# Patient Record
Sex: Female | Born: 1960 | Race: White | Hispanic: No | Marital: Married | Smoking: Never smoker
Health system: Southern US, Community
[De-identification: ages and names within clinical notes are randomized; demographics above are authoritative.]

## PROBLEM LIST (undated history)

## (undated) DIAGNOSIS — R748 Abnormal levels of other serum enzymes: Secondary | ICD-10-CM

## (undated) DIAGNOSIS — M25559 Pain in unspecified hip: Secondary | ICD-10-CM

## (undated) DIAGNOSIS — R519 Headache, unspecified: Secondary | ICD-10-CM

## (undated) DIAGNOSIS — G2581 Restless legs syndrome: Secondary | ICD-10-CM

## (undated) DIAGNOSIS — K529 Noninfective gastroenteritis and colitis, unspecified: Secondary | ICD-10-CM

## (undated) DIAGNOSIS — R51 Headache: Secondary | ICD-10-CM

## (undated) DIAGNOSIS — E039 Hypothyroidism, unspecified: Secondary | ICD-10-CM

## (undated) DIAGNOSIS — M940 Chondrocostal junction syndrome [Tietze]: Secondary | ICD-10-CM

## (undated) DIAGNOSIS — M545 Low back pain, unspecified: Secondary | ICD-10-CM

## (undated) DIAGNOSIS — J302 Other seasonal allergic rhinitis: Secondary | ICD-10-CM

## (undated) DIAGNOSIS — M5416 Radiculopathy, lumbar region: Secondary | ICD-10-CM

## (undated) DIAGNOSIS — K219 Gastro-esophageal reflux disease without esophagitis: Secondary | ICD-10-CM

## (undated) DIAGNOSIS — H40053 Ocular hypertension, bilateral: Secondary | ICD-10-CM

## (undated) DIAGNOSIS — R7303 Prediabetes: Secondary | ICD-10-CM

## (undated) DIAGNOSIS — M79643 Pain in unspecified hand: Secondary | ICD-10-CM

## (undated) DIAGNOSIS — M1990 Unspecified osteoarthritis, unspecified site: Secondary | ICD-10-CM

## (undated) DIAGNOSIS — M47816 Spondylosis without myelopathy or radiculopathy, lumbar region: Secondary | ICD-10-CM

## (undated) HISTORY — DX: Pain in unspecified hip: M25.559

## (undated) HISTORY — DX: Spondylosis without myelopathy or radiculopathy, lumbar region: M47.816

## (undated) HISTORY — DX: Pain in unspecified hand: M79.643

## (undated) HISTORY — DX: Low back pain, unspecified: M54.50

## (undated) HISTORY — PX: DILATION AND CURETTAGE OF UTERUS: SHX78

## (undated) HISTORY — PX: ANKLE ARTHROSCOPY: SHX545

## (undated) HISTORY — PX: KNEE ARTHROSCOPY: SUR90

## (undated) HISTORY — PX: WISDOM TOOTH EXTRACTION: SHX21

## (undated) HISTORY — PX: OTHER SURGICAL HISTORY: SHX169

## (undated) HISTORY — DX: Radiculopathy, lumbar region: M54.16

## (undated) HISTORY — PX: CHOLECYSTECTOMY: SHX55

## (undated) HISTORY — PX: CATARACT EXTRACTION: SUR2

---
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
## 1999-03-27 ENCOUNTER — Other Ambulatory Visit: Admission: RE | Admit: 1999-03-27 | Discharge: 1999-03-27 | Payer: Self-pay | Admitting: Obstetrics and Gynecology

## 2000-05-13 ENCOUNTER — Other Ambulatory Visit: Admission: RE | Admit: 2000-05-13 | Discharge: 2000-05-13 | Payer: Self-pay | Admitting: Obstetrics & Gynecology

## 2001-05-20 ENCOUNTER — Other Ambulatory Visit: Admission: RE | Admit: 2001-05-20 | Discharge: 2001-05-20 | Payer: Self-pay | Admitting: Obstetrics & Gynecology

## 2002-06-20 ENCOUNTER — Other Ambulatory Visit: Admission: RE | Admit: 2002-06-20 | Discharge: 2002-06-20 | Payer: Self-pay | Admitting: Obstetrics & Gynecology

## 2003-07-11 ENCOUNTER — Other Ambulatory Visit: Admission: RE | Admit: 2003-07-11 | Discharge: 2003-07-11 | Payer: Self-pay | Admitting: Obstetrics & Gynecology

## 2004-07-10 ENCOUNTER — Other Ambulatory Visit: Admission: RE | Admit: 2004-07-10 | Discharge: 2004-07-10 | Payer: Self-pay | Admitting: Obstetrics & Gynecology

## 2004-09-08 ENCOUNTER — Encounter (INDEPENDENT_AMBULATORY_CARE_PROVIDER_SITE_OTHER): Payer: Self-pay | Admitting: Specialist

## 2004-09-08 ENCOUNTER — Ambulatory Visit (HOSPITAL_COMMUNITY): Admission: RE | Admit: 2004-09-08 | Discharge: 2004-09-08 | Payer: Self-pay | Admitting: Obstetrics & Gynecology

## 2004-12-16 ENCOUNTER — Encounter: Admission: RE | Admit: 2004-12-16 | Discharge: 2004-12-16 | Payer: Self-pay | Admitting: Neurology

## 2005-07-14 ENCOUNTER — Other Ambulatory Visit: Admission: RE | Admit: 2005-07-14 | Discharge: 2005-07-14 | Payer: Self-pay | Admitting: Obstetrics & Gynecology

## 2006-09-03 ENCOUNTER — Encounter: Admission: RE | Admit: 2006-09-03 | Discharge: 2006-09-03 | Payer: Self-pay | Admitting: Internal Medicine

## 2008-06-19 ENCOUNTER — Encounter: Admission: RE | Admit: 2008-06-19 | Discharge: 2008-06-19 | Payer: Self-pay | Admitting: Internal Medicine

## 2009-09-26 ENCOUNTER — Ambulatory Visit (HOSPITAL_COMMUNITY): Admission: RE | Admit: 2009-09-26 | Discharge: 2009-09-26 | Payer: Self-pay | Admitting: Ophthalmology

## 2010-09-01 ENCOUNTER — Other Ambulatory Visit: Payer: Self-pay | Admitting: Obstetrics & Gynecology

## 2010-10-13 LAB — CBC
Hemoglobin: 14.3 g/dL (ref 12.0–15.0)
RBC: 4.39 MIL/uL (ref 3.87–5.11)
RDW: 13.6 % (ref 11.5–15.5)

## 2010-10-13 LAB — BASIC METABOLIC PANEL
Calcium: 9.3 mg/dL (ref 8.4–10.5)
Chloride: 108 mEq/L (ref 96–112)
GFR calc non Af Amer: >60 mL/min (ref >60)
Glucose, Bld: 96 mg/dL (ref 70–99)

## 2010-10-13 LAB — DIFFERENTIAL
Eosinophils Absolute: 1 10*3/uL — ABNORMAL HIGH (ref 0.0–0.7)
Eosinophils Relative: 13 % — ABNORMAL HIGH (ref 0–5)
Monocytes Relative: 8 % (ref 3–12)
Neutro Abs: 3.7 10*3/uL (ref 1.7–7.7)

## 2010-12-05 NOTE — Op Note (Signed)
NAMEBIRTHA, Lori [name]               ACCOUNT NO.:  [id number]   MEDICAL RECORD NO.:  [id number]          PATIENT TYPE:  AMB   LOCATION:  SDC                           FACILITY:  [facility]   PHYSICIAN:  Gerrit Friends. Aldona Bar, M.D.   DATE OF BIRTH:  [birth year]   DATE OF PROCEDURE:  09/08/2004  DATE OF DISCHARGE:                                 OPERATIVE REPORT   PREOPERATIVE DIAGNOSES:  1.  Abnormal uterine bleeding.  2.  Suspect polyp.   POSTOPERATIVE DIAGNOSES:  1.  Abnormal uterine bleeding.  2.  Suspect polyp.  3.  Pathology pending.   PROCEDURE:  Examination under anesthesia and dilatation and curettage.   ANESTHESIA:  Intravenous conscious sedation and paracervical block with 1%  Xylocaine without epinephrine.   SURGEON:  Gerrit Friends. Aldona Bar, M.D.   INDICATIONS FOR PROCEDURE:  This 50 year old patient was noted on  sonohistogram to have a 1.3-cm polypoid mass at the fundus of the uterus.  The cavity was otherwise normal.  She had this because of abnormal uterine  bleeding.  She is now taken to the operating room for a D&C and hopefully  removal of the polypoid-like mass.   DESCRIPTION OF PROCEDURE:  The patient was taken to the operating room where  after the satisfactory induction of intravenous conscious sedation, she was  prepped and draped in the usual fashion after being placed in the modified  lithotomy position in the short Allen stirrups.  The bladder was drained of  clear urine with a red rubber catheter in an in and out fashion.   At this time, a speculum was placed in the vagina after examination under  anesthesia was carried out, with findings consistent with a normal size,  mobile uterus with normal ovaries bilaterally.   After the speculum was placed, good visualization of the cervix was noted.  A single-tooth tenaculum was placed on the anterior lip of the cervix.  At  this time, a paracervical block was carried out using approximately 20 cc of  1% Xylocaine without  epinephrine.  The patient had some cervical stenosis  which was overcome with care in dilatation.  The patient was dilated to a  #25 Pratt dilator.  The uterus sounded to 8 cm.   At this time, using the small polyp forceps, the cavity was probed  thoroughly, especially in the posterior fundal area.  An average amount of  tissue was produced in addition to a polypoid-like mass consistent with  probably a myoma as opposed to a polyp.  The cavity was then thoroughly,  gently, and systematically curetted with a small serrated curette, with  production of a normal amount of tissue.  Further probing with the small  polyp forceps produced no additional polypoid-like tissue, and further  curettage produced no significant endometrial tissue.  All specimens were  sent to pathology appropriately labeled.  At this time, the procedure was  felt to be complete and was terminated.  All instruments were removed.  Bleeding was minimal.   The patient was awakened and transported to the recovery room in  satisfactory condition, having tolerated  the procedure well.  Estimated  blood loss was negligible.  All counts were correct x2.  Pathologic specimen  consistent with endometrial curettings and a polypoid mass consistent with a  myoma versus polyp.  The patient will be observed in recovery and then  discharged to home.  She will use Anaprox DS which she has at home for  cramping if needed and will be followed up in the office in approximately  two weeks' time.      RMW/MEDQ  D:  09/08/2004  T:  09/08/2004  Job:  [id number]

## 2015-07-08 NOTE — H&P (Signed)
  Lori [name] is an 54 y.o. female.    Chief Complaint: left knee pain  HPI: Pt is a 54 y.o. female complaining of left knee pain for multiple years. Pain had continually increased since the beginning. X-rays in the clinic show end-stage arthritic changes of the left knee. Pt has tried various conservative treatments which have failed to alleviate their symptoms, including injections and therapy. Various options are discussed with the patient. Risks, benefits and expectations were discussed with the patient. Patient understand the risks, benefits and expectations and wishes to proceed with surgery.   PCP:  No primary care provider on file.  D/C Plans: Home  PMH: No past medical history on file.  PSH: No past surgical history on file.  Social History:  has no tobacco, alcohol, and drug history on file.  Allergies:  Allergies not on file  Medications: No current facility-administered medications for this encounter.   No current outpatient prescriptions on file.    No results found for this or any previous visit (from the past 48 hour(s)). No results found.  ROS: Pain with rom of the left lower extremity  Physical Exam:  Alert and oriented 54 y.o. female in no acute distress Cranial nerves 2-12 intact Cervical spine: full rom with no tenderness, nv intact distally Chest: active breath sounds bilaterally, no wheeze rhonchi or rales Heart: regular rate and rhythm, no murmur Abd: non tender non distended with active bowel sounds Hip is stable with rom  Left knee painful rom with crepitus Antalgic gait No rashes or edema   Assessment/Plan Assessment: left knee end stage osteoarthritis   Plan: Patient will undergo a left total knee arthroplasty by Dr. Veverly Fells at [facility] Risks benefits and expectations were discussed with the patient. Patient understand risks, benefits and expectations and wishes to proceed.

## 2015-07-16 ENCOUNTER — Other Ambulatory Visit (HOSPITAL_COMMUNITY): Payer: Self-pay | Admitting: *Deleted

## 2015-07-16 NOTE — Pre-Procedure Instructions (Signed)
Lori [name]  07/16/2015      [facility] SERVICE - [place], [address] [place] [address] Phone: [phone] Fax: [phone]  [id number] - [place], [place] [address] [place] [address] Phone: [phone] Fax: [phone]    Your procedure is scheduled on Friday, July 26, 2015 at 7:30 AM.   Report to [facility] Entrance "A" Admitting Office at 5:30 AM.   Call this number if you have problems the morning of surgery: [phone]   Any questions prior to day of surgery, please call [phone] between 8 & 4 PM.   Remember:  Do not eat food or drink liquids after midnight Thursday, 07/25/15.   Take these medicines the morning of surgery with A SIP OF WATER: Levothyroxine (Synthroid)  Stop Aspirin products (Excedrin Migraine, Goody's, etc.), Herbal Medications, Fish Oil and Multivitamins.   Do not wear jewelry, make-up or nail polish.  Do not wear lotions, powders, or perfumes.  You may wear deodorant.  Do not shave 48 hours prior to surgery.    Do not bring valuables to the hospital.  [facility] is not responsible for any belongings or valuables.  Contacts, dentures or bridgework may not be worn into surgery.  Leave your suitcase in the car.  After surgery it may be brought to your room.  For patients admitted to the hospital, discharge time will be determined by your treatment team.  Special instructions:  [facility] - Preparing for Surgery  Before surgery, you can play an important role.  Because skin is not sterile, your skin needs to be as free of germs as possible.  You can reduce the number of germs on you skin by washing with CHG (chlorahexidine gluconate) soap before surgery.  CHG is an antiseptic cleaner which kills germs and bonds with the skin to continue killing germs even after washing.  Please DO NOT use if you have an  allergy to CHG or antibacterial soaps.  If your skin becomes reddened/irritated stop using the CHG and inform your nurse when you arrive at Short Stay.  Do not shave (including legs and underarms) for at least 48 hours prior to the first CHG shower.  You may shave your face.  Please follow these instructions carefully:   1.  Shower with CHG Soap the night before surgery and the                                morning of Surgery.  2.  If you choose to wash your hair, wash your hair first as usual with your       normal shampoo.  3.  After you shampoo, rinse your hair and body thoroughly to remove the                      Shampoo.  4.  Use CHG as you would any other liquid soap.  You can apply chg directly       to the skin and wash gently with scrungie or a clean washcloth.  5.  Apply the CHG Soap to your body ONLY FROM THE NECK DOWN.        Do not use on open wounds or open sores.  Avoid contact with your eyes, ears, mouth and genitals (private  parts).  Wash genitals (private parts) with your normal soap.  6.  Wash thoroughly, paying special attention to the area where your surgery        will be performed.  7.  Thoroughly rinse your body with warm water from the neck down.  8.  DO NOT shower/wash with your normal soap after using and rinsing off       the CHG Soap.  9.  Pat yourself dry with a clean towel.            10.  Wear clean pajamas.            11.  Place clean sheets on your bed the night of your first shower and do not        sleep with pets.  Day of Surgery  Do not apply any lotions the morning of surgery.  Please wear clean clothes to the hospital.   Please read over the following fact sheets that you were given. Pain Booklet, Coughing and Deep Breathing, MRSA Information and Surgical Site Infection Prevention

## 2015-07-17 ENCOUNTER — Encounter (HOSPITAL_COMMUNITY): Payer: Self-pay

## 2015-07-17 ENCOUNTER — Encounter (HOSPITAL_COMMUNITY)
Admission: RE | Admit: 2015-07-17 | Discharge: 2015-07-17 | Disposition: A | Discharge status: Home / Self Care | Payer: 59 | Source: Ambulatory Visit | Attending: Orthopedic Surgery | Admitting: Orthopedic Surgery

## 2015-07-17 DIAGNOSIS — Z01812 Encounter for preprocedural laboratory examination: Secondary | ICD-10-CM | POA: Diagnosis not present

## 2015-07-17 DIAGNOSIS — M1712 Unilateral primary osteoarthritis, left knee: Secondary | ICD-10-CM | POA: Diagnosis not present

## 2015-07-17 DIAGNOSIS — Z01818 Encounter for other preprocedural examination: Secondary | ICD-10-CM | POA: Diagnosis present

## 2015-07-17 DIAGNOSIS — E119 Type 2 diabetes mellitus without complications: Secondary | ICD-10-CM | POA: Diagnosis not present

## 2015-07-17 HISTORY — DX: Hypothyroidism, unspecified: E03.9

## 2015-07-17 HISTORY — DX: Gastro-esophageal reflux disease without esophagitis: K21.9

## 2015-07-17 LAB — BASIC METABOLIC PANEL
Anion gap: 8 (ref 5–15)
BUN: 18 mg/dL (ref 6–20)
CO2: 27 mmol/L (ref 22–32)
Calcium: 10 mg/dL (ref 8.9–10.3)
Chloride: 107 mmol/L (ref 101–111)
Creatinine, Ser: 0.9 mg/dL (ref 0.44–1.00)
GFR calc Af Amer: >60 mL/min (ref >60)
GFR calc non Af Amer: >60 mL/min (ref >60)
GLUCOSE: 64 mg/dL — AB (ref 65–99)
POTASSIUM: 4.2 mmol/L (ref 3.5–5.1)
SODIUM: 142 mmol/L (ref 135–145)

## 2015-07-17 LAB — CBC
HEMATOCRIT: 43.9 % (ref 36.0–46.0)
HEMOGLOBIN: 14.7 g/dL (ref 12.0–15.0)
MCH: 31.5 pg (ref 26.0–34.0)
MCHC: 33.5 g/dL (ref 30.0–36.0)
MCV: 94.2 fL (ref 78.0–100.0)
Platelets: 174 10*3/uL (ref 150–400)
RBC: 4.66 MIL/uL (ref 3.87–5.11)
RDW: 14.2 % (ref 11.5–15.5)
WBC: 7.6 10*3/uL (ref 4.0–10.5)

## 2015-07-17 LAB — GLUCOSE, CAPILLARY: Glucose-Capillary: 88 mg/dL (ref 65–99)

## 2015-07-17 LAB — SURGICAL PCR SCREEN
MRSA, PCR: NEGATIVE
Staphylococcus aureus: NEGATIVE

## 2015-07-25 MED ORDER — CEFAZOLIN SODIUM-DEXTROSE 2-3 GM-% IV SOLR
2.0000 g | INTRAVENOUS | Status: AC
  Administered 2015-07-26: 2 g via INTRAVENOUS
  Filled 2015-07-25: qty 50

## 2015-07-25 MED ORDER — CHLORHEXIDINE GLUCONATE 4 % EX LIQD
60.0000 mL | Freq: Once | CUTANEOUS | Status: DC
Start: 2015-07-26 — End: 2015-07-26

## 2015-07-25 NOTE — Anesthesia Preprocedure Evaluation (Addendum)
Anesthesia Evaluation  Patient identified by MRN, date of birth, ID band Patient awake    Reviewed: Allergy & Precautions, H&P , NPO status , Patient's Chart, lab work & pertinent test results  Airway Mallampati: II  TM Distance: >3 FB Neck ROM: Full    Dental no notable dental hx. (+) Teeth Intact, Dental Advisory Given   Pulmonary neg pulmonary ROS,    Pulmonary exam normal breath sounds clear to auscultation       Cardiovascular negative cardio ROS   Rhythm:Regular Rate:Normal     Neuro/Psych negative neurological ROS  negative psych ROS   GI/Hepatic Neg liver ROS, GERD  Controlled,  Endo/Other  diabetes, Well ControlledHypothyroidism   Renal/GU negative Renal ROS  negative genitourinary   Musculoskeletal   Abdominal   Peds  Hematology negative hematology ROS (+)   Anesthesia Other Findings   Reproductive/Obstetrics negative OB ROS                           Anesthesia Physical Anesthesia Plan  ASA: II  Anesthesia Plan: Spinal and MAC   Post-op Pain Management: MAC Combined w/ Regional for Post-op pain   Induction: Intravenous  Airway Management Planned: Simple Face Mask  Additional Equipment:   Intra-op Plan:   Post-operative Plan:   Informed Consent: I have reviewed the patients History and Physical, chart, labs and discussed the procedure including the risks, benefits and alternatives for the proposed anesthesia with the patient or authorized representative who has indicated his/her understanding and acceptance.   Dental advisory given  Plan Discussed with: CRNA  Anesthesia Plan Comments:         Anesthesia Quick Evaluation

## 2015-07-26 ENCOUNTER — Inpatient Hospital Stay (HOSPITAL_COMMUNITY): Payer: 59

## 2015-07-26 ENCOUNTER — Encounter (HOSPITAL_COMMUNITY): Payer: Self-pay | Admitting: *Deleted

## 2015-07-26 ENCOUNTER — Inpatient Hospital Stay (HOSPITAL_COMMUNITY)
Admission: RE | Admit: 2015-07-26 | Discharge: 2015-07-29 | DRG: 470 | Disposition: A | Discharge status: Home Health | Payer: 59 | Source: Ambulatory Visit | Attending: Orthopedic Surgery | Admitting: Orthopedic Surgery

## 2015-07-26 ENCOUNTER — Inpatient Hospital Stay (HOSPITAL_COMMUNITY): Payer: 59 | Admitting: Anesthesiology

## 2015-07-26 ENCOUNTER — Encounter (HOSPITAL_COMMUNITY)
Admission: RE | Disposition: A | Discharge status: Home Health | Payer: Self-pay | Source: Ambulatory Visit | Attending: Orthopedic Surgery

## 2015-07-26 DIAGNOSIS — E119 Type 2 diabetes mellitus without complications: Secondary | ICD-10-CM | POA: Diagnosis present

## 2015-07-26 DIAGNOSIS — E039 Hypothyroidism, unspecified: Secondary | ICD-10-CM | POA: Diagnosis present

## 2015-07-26 DIAGNOSIS — Z96652 Presence of left artificial knee joint: Secondary | ICD-10-CM

## 2015-07-26 DIAGNOSIS — Z96659 Presence of unspecified artificial knee joint: Secondary | ICD-10-CM

## 2015-07-26 DIAGNOSIS — K219 Gastro-esophageal reflux disease without esophagitis: Secondary | ICD-10-CM | POA: Diagnosis present

## 2015-07-26 DIAGNOSIS — M1712 Unilateral primary osteoarthritis, left knee: Principal | ICD-10-CM | POA: Diagnosis present

## 2015-07-26 DIAGNOSIS — M25562 Pain in left knee: Secondary | ICD-10-CM | POA: Diagnosis present

## 2015-07-26 HISTORY — DX: Unspecified osteoarthritis, unspecified site: M19.90

## 2015-07-26 HISTORY — DX: Headache: R51

## 2015-07-26 HISTORY — DX: Headache, unspecified: R51.9

## 2015-07-26 HISTORY — PX: TOTAL KNEE ARTHROPLASTY: SHX125

## 2015-07-26 LAB — CBC
HCT: 39.5 % (ref 36.0–46.0)
Hemoglobin: 13.1 g/dL (ref 12.0–15.0)
MCH: 31 pg (ref 26.0–34.0)
MCHC: 33.2 g/dL (ref 30.0–36.0)
MCV: 93.4 fL (ref 78.0–100.0)
Platelets: 158 10*3/uL (ref 150–400)
RBC: 4.23 MIL/uL (ref 3.87–5.11)
RDW: 13.6 % (ref 11.5–15.5)
WBC: 8.6 10*3/uL (ref 4.0–10.5)

## 2015-07-26 LAB — GLUCOSE, CAPILLARY
GLUCOSE-CAPILLARY: 86 mg/dL (ref 65–99)
Glucose-Capillary: 97 mg/dL (ref 65–99)

## 2015-07-26 LAB — CREATININE, SERUM
CREATININE: 0.86 mg/dL (ref 0.44–1.00)
GFR calc Af Amer: >60 mL/min (ref >60)

## 2015-07-26 SURGERY — ARTHROPLASTY, KNEE, TOTAL
Anesthesia: Monitor Anesthesia Care | Site: Knee | Laterality: Left

## 2015-07-26 MED ORDER — ADULT MULTIVITAMIN W/MINERALS CH
1.0000 | ORAL_TABLET | Freq: Every day | ORAL | Status: DC
  Administered 2015-07-27 – 2015-07-29 (×3): 1 via ORAL
  Filled 2015-07-26 (×3): qty 1

## 2015-07-26 MED ORDER — COQ10 100 MG PO CAPS
ORAL_CAPSULE | ORAL | Status: DC
Start: 2015-07-27 — End: 2015-07-26

## 2015-07-26 MED ORDER — ASPIRIN-ACETAMINOPHEN-CAFFEINE 250-250-65 MG PO TABS: 2.0000 | ORAL_TABLET | Freq: Four times a day (QID) | ORAL | Status: DC | PRN

## 2015-07-26 MED ORDER — ENOXAPARIN SODIUM 30 MG/0.3ML ~~LOC~~ SOLN
30.0000 mg | Freq: Two times a day (BID) | SUBCUTANEOUS | Status: DC
  Administered 2015-07-27 – 2015-07-29 (×5): 30 mg via SUBCUTANEOUS
  Filled 2015-07-26 (×5): qty 0.3

## 2015-07-26 MED ORDER — ACETAMINOPHEN 650 MG RE SUPP: 650.0000 mg | Freq: Four times a day (QID) | RECTAL | Status: DC | PRN

## 2015-07-26 MED ORDER — OXYCODONE-ACETAMINOPHEN 5-325 MG PO TABS: 1.0000 | ORAL_TABLET | ORAL | Status: DC | PRN

## 2015-07-26 MED ORDER — ONDANSETRON HCL 4 MG/2ML IJ SOLN
4.0000 mg | Freq: Four times a day (QID) | INTRAMUSCULAR | Status: DC | PRN
  Administered 2015-07-26 – 2015-07-28 (×6): 4 mg via INTRAVENOUS
  Filled 2015-07-26 (×5): qty 2

## 2015-07-26 MED ORDER — PHENOL 1.4 % MT LIQD: 1.0000 | OROMUCOSAL | Status: DC | PRN

## 2015-07-26 MED ORDER — PHENYLEPHRINE HCL 10 MG/ML IJ SOLN
INTRAMUSCULAR | Status: DC | PRN
  Administered 2015-07-26 (×3): 80 ug via INTRAVENOUS

## 2015-07-26 MED ORDER — METOCLOPRAMIDE HCL 5 MG/ML IJ SOLN
5.0000 mg | Freq: Three times a day (TID) | INTRAMUSCULAR | Status: DC | PRN
  Administered 2015-07-26 – 2015-07-27 (×2): 10 mg via INTRAVENOUS
  Filled 2015-07-26 (×2): qty 2

## 2015-07-26 MED ORDER — POLYETHYLENE GLYCOL 3350 17 G PO PACK
17.0000 g | PACK | Freq: Every day | ORAL | Status: DC | PRN
  Filled 2015-07-26 (×2): qty 1

## 2015-07-26 MED ORDER — OSTEO BI-FLEX ADV DOUBLE ST PO TABS: 2.0000 | ORAL_TABLET | ORAL | Status: DC

## 2015-07-26 MED ORDER — ONDANSETRON HCL 4 MG/2ML IJ SOLN
INTRAMUSCULAR | Status: AC
  Filled 2015-07-26: qty 2

## 2015-07-26 MED ORDER — LATANOPROST 0.005 % OP SOLN
1.0000 [drp] | Freq: Every day | OPHTHALMIC | Status: DC
  Administered 2015-07-26 – 2015-07-28 (×3): 1 [drp] via OPHTHALMIC
  Filled 2015-07-26: qty 2.5

## 2015-07-26 MED ORDER — MIDAZOLAM HCL 5 MG/5ML IJ SOLN
INTRAMUSCULAR | Status: DC | PRN
  Administered 2015-07-26: 2 mg via INTRAVENOUS

## 2015-07-26 MED ORDER — DHA-EPA-VITAMIN E 192-251-11 MG-MG-UNIT PO CAPS: ORAL_CAPSULE | Freq: Two times a day (BID) | ORAL | Status: DC

## 2015-07-26 MED ORDER — CEFAZOLIN SODIUM-DEXTROSE 2-3 GM-% IV SOLR
2.0000 g | Freq: Four times a day (QID) | INTRAVENOUS | Status: AC
  Administered 2015-07-26 (×2): 2 g via INTRAVENOUS
  Filled 2015-07-26 (×2): qty 50

## 2015-07-26 MED ORDER — PATIENT'S GUIDE TO USING COUMADIN BOOK
Freq: Once | Status: DC
  Filled 2015-07-26: qty 1

## 2015-07-26 MED ORDER — HYDROMORPHONE HCL 1 MG/ML IJ SOLN
1.0000 mg | INTRAMUSCULAR | Status: DC | PRN
Start: 2015-07-26 — End: 2015-07-29
  Administered 2015-07-26 – 2015-07-27 (×4): 1 mg via INTRAVENOUS
  Filled 2015-07-26 (×5): qty 1

## 2015-07-26 MED ORDER — FENTANYL CITRATE (PF) 250 MCG/5ML IJ SOLN
INTRAMUSCULAR | Status: AC
  Filled 2015-07-26: qty 5

## 2015-07-26 MED ORDER — HYDROMORPHONE HCL 1 MG/ML IJ SOLN
INTRAMUSCULAR | Status: AC
  Administered 2015-07-26: 0.5 mg via INTRAVENOUS
  Filled 2015-07-26: qty 1

## 2015-07-26 MED ORDER — LEVOTHYROXINE SODIUM 50 MCG PO TABS
50.0000 ug | ORAL_TABLET | Freq: Every day | ORAL | Status: DC
  Administered 2015-07-27 – 2015-07-29 (×3): 50 ug via ORAL
  Filled 2015-07-26 (×3): qty 1

## 2015-07-26 MED ORDER — METHYLCELLULOSE (LAXATIVE) 500 MG PO TABS: ORAL_TABLET | Freq: Every day | ORAL | Status: DC

## 2015-07-26 MED ORDER — ACETAMINOPHEN 325 MG PO TABS
650.0000 mg | ORAL_TABLET | Freq: Four times a day (QID) | ORAL | Status: DC | PRN
Start: 2015-07-26 — End: 2015-07-29
  Administered 2015-07-27: 650 mg via ORAL
  Filled 2015-07-26: qty 2

## 2015-07-26 MED ORDER — WARFARIN - PHARMACIST DOSING INPATIENT: Freq: Every day | Status: DC

## 2015-07-26 MED ORDER — SCOPOLAMINE 1 MG/3DAYS TD PT72
MEDICATED_PATCH | TRANSDERMAL | Status: AC
  Administered 2015-07-26: 1.5 mg via TRANSDERMAL
  Filled 2015-07-26: qty 1

## 2015-07-26 MED ORDER — LIDOCAINE HCL (CARDIAC) 20 MG/ML IV SOLN
INTRAVENOUS | Status: AC
  Filled 2015-07-26: qty 5

## 2015-07-26 MED ORDER — MONTELUKAST SODIUM 10 MG PO TABS
10.0000 mg | ORAL_TABLET | Freq: Every day | ORAL | Status: DC
  Administered 2015-07-26 – 2015-07-28 (×3): 10 mg via ORAL
  Filled 2015-07-26 (×3): qty 1

## 2015-07-26 MED ORDER — ONDANSETRON HCL 4 MG/2ML IJ SOLN
INTRAMUSCULAR | Status: DC | PRN
  Administered 2015-07-26: 4 mg via INTRAVENOUS

## 2015-07-26 MED ORDER — BISACODYL 10 MG RE SUPP: 10.0000 mg | Freq: Every day | RECTAL | Status: DC | PRN

## 2015-07-26 MED ORDER — PROPOFOL 500 MG/50ML IV EMUL
INTRAVENOUS | Status: DC | PRN
  Administered 2015-07-26: 75 ug/kg/min via INTRAVENOUS

## 2015-07-26 MED ORDER — METHOCARBAMOL 500 MG PO TABS
ORAL_TABLET | ORAL | Status: AC
  Administered 2015-07-26: 500 mg via ORAL
  Filled 2015-07-26: qty 1

## 2015-07-26 MED ORDER — METHOCARBAMOL 500 MG PO TABS
500.0000 mg | ORAL_TABLET | Freq: Four times a day (QID) | ORAL | Status: DC | PRN
  Administered 2015-07-26 – 2015-07-28 (×8): 500 mg via ORAL
  Filled 2015-07-26 (×7): qty 1

## 2015-07-26 MED ORDER — SODIUM CHLORIDE 0.9 % IR SOLN
Status: DC | PRN
  Administered 2015-07-26: 1000 mL

## 2015-07-26 MED ORDER — METHOCARBAMOL 500 MG PO TABS: 500.0000 mg | ORAL_TABLET | Freq: Three times a day (TID) | ORAL | Status: DC | PRN

## 2015-07-26 MED ORDER — WARFARIN VIDEO
Freq: Once | Status: DC
Start: 2015-07-27 — End: 2015-07-29

## 2015-07-26 MED ORDER — ONDANSETRON HCL 4 MG PO TABS
4.0000 mg | ORAL_TABLET | Freq: Four times a day (QID) | ORAL | Status: DC | PRN
  Administered 2015-07-27 – 2015-07-28 (×3): 4 mg via ORAL
  Filled 2015-07-26 (×3): qty 1

## 2015-07-26 MED ORDER — SCOPOLAMINE 1 MG/3DAYS TD PT72
1.0000 | MEDICATED_PATCH | Freq: Once | TRANSDERMAL | Status: AC
  Administered 2015-07-26: 1.5 mg via TRANSDERMAL

## 2015-07-26 MED ORDER — OXYCODONE HCL 5 MG PO TABS
ORAL_TABLET | ORAL | Status: AC
  Administered 2015-07-26: 10 mg via ORAL
  Filled 2015-07-26: qty 2

## 2015-07-26 MED ORDER — ATORVASTATIN CALCIUM 10 MG PO TABS
10.0000 mg | ORAL_TABLET | Freq: Every day | ORAL | Status: DC
Start: 2015-07-26 — End: 2015-07-29
  Administered 2015-07-26 – 2015-07-29 (×4): 10 mg via ORAL
  Filled 2015-07-26 (×4): qty 1

## 2015-07-26 MED ORDER — ONDANSETRON HCL 4 MG/2ML IJ SOLN
INTRAMUSCULAR | Status: AC
  Administered 2015-07-26: 4 mg via INTRAVENOUS
  Filled 2015-07-26: qty 2

## 2015-07-26 MED ORDER — HYDROMORPHONE HCL 1 MG/ML IJ SOLN
INTRAMUSCULAR | Status: AC
  Administered 2015-07-26: 1 mg via INTRAVENOUS
  Filled 2015-07-26: qty 1

## 2015-07-26 MED ORDER — SAFFLOWER OIL 1000 MG PO CAPS: ORAL_CAPSULE | Freq: Three times a day (TID) | ORAL | Status: DC

## 2015-07-26 MED ORDER — WARFARIN SODIUM 7.5 MG PO TABS
7.5000 mg | ORAL_TABLET | Freq: Once | ORAL | Status: AC
  Administered 2015-07-26: 7.5 mg via ORAL
  Filled 2015-07-26: qty 1

## 2015-07-26 MED ORDER — PROPOFOL 10 MG/ML IV BOLUS
INTRAVENOUS | Status: AC
  Filled 2015-07-26: qty 20

## 2015-07-26 MED ORDER — BUPIVACAINE IN DEXTROSE 0.75-8.25 % IT SOLN
INTRATHECAL | Status: DC | PRN
Start: 2015-07-26 — End: 2015-07-26
  Administered 2015-07-26: 15 mg via INTRATHECAL

## 2015-07-26 MED ORDER — METOCLOPRAMIDE HCL 5 MG PO TABS
5.0000 mg | ORAL_TABLET | Freq: Three times a day (TID) | ORAL | Status: DC | PRN
  Administered 2015-07-28: 10 mg via ORAL
  Filled 2015-07-26: qty 2

## 2015-07-26 MED ORDER — OXYCODONE HCL 5 MG PO TABS
5.0000 mg | ORAL_TABLET | ORAL | Status: DC | PRN
  Administered 2015-07-26 – 2015-07-29 (×17): 10 mg via ORAL
  Filled 2015-07-26 (×15): qty 2

## 2015-07-26 MED ORDER — MIDAZOLAM HCL 2 MG/2ML IJ SOLN
INTRAMUSCULAR | Status: AC
  Filled 2015-07-26: qty 2

## 2015-07-26 MED ORDER — LACTATED RINGERS IV SOLN
INTRAVENOUS | Status: DC | PRN
  Administered 2015-07-26 (×2): via INTRAVENOUS

## 2015-07-26 MED ORDER — WARFARIN SODIUM 5 MG PO TABS: 5.0000 mg | ORAL_TABLET | Freq: Every day | ORAL | Status: DC

## 2015-07-26 MED ORDER — DOCUSATE SODIUM 100 MG PO CAPS
100.0000 mg | ORAL_CAPSULE | Freq: Two times a day (BID) | ORAL | Status: DC
  Administered 2015-07-26 – 2015-07-29 (×6): 100 mg via ORAL
  Filled 2015-07-26 (×6): qty 1

## 2015-07-26 MED ORDER — SODIUM CHLORIDE 0.9 % IV SOLN: INTRAVENOUS | Status: DC

## 2015-07-26 MED ORDER — ICAPS AREDS 2 PO CAPS: 1.0000 | ORAL_CAPSULE | Freq: Two times a day (BID) | ORAL | Status: DC

## 2015-07-26 MED ORDER — HYDROMORPHONE HCL 1 MG/ML IJ SOLN
0.2500 mg | INTRAMUSCULAR | Status: DC | PRN
  Administered 2015-07-26 (×4): 0.5 mg via INTRAVENOUS

## 2015-07-26 MED ORDER — PHENYLEPHRINE 40 MCG/ML (10ML) SYRINGE FOR IV PUSH (FOR BLOOD PRESSURE SUPPORT)
PREFILLED_SYRINGE | INTRAVENOUS | Status: AC
  Filled 2015-07-26: qty 10

## 2015-07-26 MED ORDER — BUPIVACAINE-EPINEPHRINE (PF) 0.5% -1:200000 IJ SOLN
INTRAMUSCULAR | Status: DC | PRN
  Administered 2015-07-26: 30 mL via PERINEURAL

## 2015-07-26 MED ORDER — FENTANYL CITRATE (PF) 100 MCG/2ML IJ SOLN
INTRAMUSCULAR | Status: DC | PRN
  Administered 2015-07-26: 50 ug via INTRAVENOUS

## 2015-07-26 MED ORDER — MENTHOL 3 MG MT LOZG: 1.0000 | LOZENGE | OROMUCOSAL | Status: DC | PRN

## 2015-07-26 MED ORDER — METHOCARBAMOL 1000 MG/10ML IJ SOLN
500.0000 mg | Freq: Four times a day (QID) | INTRAVENOUS | Status: DC | PRN
  Filled 2015-07-26: qty 5

## 2015-07-26 SURGICAL SUPPLY — 66 items
BANDAGE ESMARK 6X9 LF (GAUZE/BANDAGES/DRESSINGS) ×1 IMPLANT
BLADE SAG 18X100X1.27 (BLADE) ×3 IMPLANT
BLADE SAW SGTL 13.0X1.19X90.0M (BLADE) ×3 IMPLANT
BNDG ELASTIC 6X10 VLCR STRL LF (GAUZE/BANDAGES/DRESSINGS) ×3 IMPLANT
BNDG ESMARK 6X9 LF (GAUZE/BANDAGES/DRESSINGS) ×3
BNDG GAUZE ELAST 4 BULKY (GAUZE/BANDAGES/DRESSINGS) ×6 IMPLANT
BOWL SMART MIX CTS (DISPOSABLE) ×3 IMPLANT
CAP KNEE TOTAL 3 SIGMA ×3 IMPLANT
CEMENT HV SMART SET (Cement) ×6 IMPLANT
CLOSURE STERI-STRIP 1/2X4 (GAUZE/BANDAGES/DRESSINGS) ×2
CLOSURE WOUND 1/2 X4 (GAUZE/BANDAGES/DRESSINGS)
CLSR STERI-STRIP ANTIMIC 1/2X4 (GAUZE/BANDAGES/DRESSINGS) ×4 IMPLANT
COVER SURGICAL LIGHT HANDLE (MISCELLANEOUS) ×3 IMPLANT
CUFF TOURNIQUET SINGLE 34IN LL (TOURNIQUET CUFF) IMPLANT
CUFF TOURNIQUET SINGLE 44IN (TOURNIQUET CUFF) IMPLANT
DRAPE EXTREMITY T 121X128X90 (DRAPE) ×3 IMPLANT
DRAPE IMP U-DRAPE 54X76 (DRAPES) ×3 IMPLANT
DRAPE PROXIMA HALF (DRAPES) ×3 IMPLANT
DRAPE U-SHAPE 47X51 STRL (DRAPES) ×3 IMPLANT
DRSG ADAPTIC 3X8 NADH LF (GAUZE/BANDAGES/DRESSINGS) IMPLANT
DRSG PAD ABDOMINAL 8X10 ST (GAUZE/BANDAGES/DRESSINGS) IMPLANT
DURAPREP 26ML APPLICATOR (WOUND CARE) ×3 IMPLANT
ELECT CAUTERY BLADE 6.4 (BLADE) ×3 IMPLANT
ELECT REM PT RETURN 9FT ADLT (ELECTROSURGICAL) ×3
ELECTRODE REM PT RTRN 9FT ADLT (ELECTROSURGICAL) ×1 IMPLANT
GAUZE SPONGE 4X4 12PLY STRL (GAUZE/BANDAGES/DRESSINGS) IMPLANT
GLOVE BIOGEL PI ORTHO PRO 7.5 (GLOVE) ×2
GLOVE BIOGEL PI ORTHO PRO SZ8 (GLOVE) ×2
GLOVE ORTHO TXT STRL SZ7.5 (GLOVE) ×3 IMPLANT
GLOVE PI ORTHO PRO STRL 7.5 (GLOVE) ×1 IMPLANT
GLOVE PI ORTHO PRO STRL SZ8 (GLOVE) ×1 IMPLANT
GLOVE SURG ORTHO 8.5 STRL (GLOVE) ×3 IMPLANT
GLOVE SURG SS PI 7.0 STRL IVOR (GLOVE) ×3 IMPLANT
GOWN STRL REUS W/ TWL XL LVL3 (GOWN DISPOSABLE) ×3 IMPLANT
GOWN STRL REUS W/TWL XL LVL3 (GOWN DISPOSABLE) ×6
HANDPIECE INTERPULSE COAX TIP (DISPOSABLE) ×2
IMMOBILIZER KNEE 22 UNIV (SOFTGOODS) IMPLANT
KIT BASIN OR (CUSTOM PROCEDURE TRAY) ×3 IMPLANT
KIT MANIFOLD (MISCELLANEOUS) ×3 IMPLANT
KIT ROOM TURNOVER OR (KITS) ×3 IMPLANT
MANIFOLD NEPTUNE II (INSTRUMENTS) ×3 IMPLANT
NS IRRIG 1000ML POUR BTL (IV SOLUTION) ×3 IMPLANT
PACK TOTAL JOINT (CUSTOM PROCEDURE TRAY) ×3 IMPLANT
PACK UNIVERSAL I (CUSTOM PROCEDURE TRAY) ×3 IMPLANT
PAD ABD 8X10 STRL (GAUZE/BANDAGES/DRESSINGS) ×6 IMPLANT
PAD ARMBOARD 7.5X6 YLW CONV (MISCELLANEOUS) ×6 IMPLANT
SET HNDPC FAN SPRY TIP SCT (DISPOSABLE) ×1 IMPLANT
SPONGE GAUZE 4X4 12PLY STER LF (GAUZE/BANDAGES/DRESSINGS) ×3 IMPLANT
STRIP CLOSURE SKIN 1/2X4 (GAUZE/BANDAGES/DRESSINGS) IMPLANT
SUCTION FRAZIER TIP 10 FR DISP (SUCTIONS) ×3 IMPLANT
SUT FIBERWIRE #2 38 REV NDL BL (SUTURE) ×3
SUT MNCRL AB 3-0 PS2 18 (SUTURE) ×3 IMPLANT
SUT VIC AB 0 CT1 27 (SUTURE) ×4
SUT VIC AB 0 CT1 27XBRD ANBCTR (SUTURE) ×2 IMPLANT
SUT VIC AB 1 CT1 27 (SUTURE) ×6
SUT VIC AB 1 CT1 27XBRD ANBCTR (SUTURE) ×3 IMPLANT
SUT VIC AB 2-0 CT1 27 (SUTURE) ×4
SUT VIC AB 2-0 CT1 TAPERPNT 27 (SUTURE) ×2 IMPLANT
SUTURE FIBERWR#2 38 REV NDL BL (SUTURE) ×1 IMPLANT
TOWEL OR 17X24 6PK STRL BLUE (TOWEL DISPOSABLE) ×3 IMPLANT
TOWEL OR 17X26 10 PK STRL BLUE (TOWEL DISPOSABLE) ×3 IMPLANT
TRAY FOLEY CATH 16FRSI W/METER (SET/KITS/TRAYS/PACK) IMPLANT
TUBE CONNECTING 12'X1/4 (SUCTIONS) ×1
TUBE CONNECTING 12X1/4 (SUCTIONS) ×2 IMPLANT
WATER STERILE IRR 1000ML POUR (IV SOLUTION) ×6 IMPLANT
YANKAUER SUCT BULB TIP NO VENT (SUCTIONS) ×3 IMPLANT

## 2015-07-26 NOTE — Anesthesia Procedure Notes (Addendum)
Anesthesia Regional Block:  Femoral nerve block  Pre-Anesthetic Checklist: ,, timeout performed, Correct Patient, Correct Site, Correct Laterality, Correct Procedure, Correct Position, site marked, Risks and benefits discussed, pre-op evaluation,  At surgeon's request and post-op pain management  Laterality: Left  Prep: Maximum Sterile Barrier Precautions used and chloraprep       Needles:  Injection technique: Single-shot  Needle Type: Echogenic Stimulator Needle     Needle Length: 5cm 5 cm Needle Gauge: 22 and 22 G    Additional Needles:  Procedures: ultrasound guided (picture in chart) Femoral nerve block  Nerve Stimulator or Paresthesia:  Response: Patellar respose,   Additional Responses:   Narrative:  Start time: 07/26/2015 7:05 AM End time: 07/26/2015 7:15 AM Injection made incrementally with aspirations every 5 mL. Anesthesiologist: Roderic Palau  Additional Notes: 2% Lidocaine skin wheel.    Spinal Patient location during procedure: OR Start time: 07/26/2015 7:32 AM End time: 07/26/2015 7:37 AM Staffing Anesthesiologist: Roderic Palau Performed by: anesthesiologist  Preanesthetic Checklist Completed: patient identified, surgical consent, pre-op evaluation, timeout performed, IV checked, risks and benefits discussed and monitors and equipment checked Spinal Block Patient position: sitting Prep: DuraPrep Patient monitoring: cardiac monitor, continuous pulse ox and blood pressure Approach: midline Location: L3-4 Injection technique: single-shot Needle Needle type: Pencan  Needle gauge: 24 G Needle length: 9 cm Assessment Sensory level: T8 Additional Notes Functioning IV was confirmed and monitors were applied. Sterile prep and drape, including hand hygiene and sterile gloves were used. The patient was positioned and the spine was prepped. The skin was anesthetized with lidocaine.  Free flow of clear CSF was obtained prior to injecting local  anesthetic into the CSF.  The spinal needle aspirated freely following injection.  The needle was carefully withdrawn.  The patient tolerated the procedure well.   Procedure Name: MAC Date/Time: 07/26/2015 7:43 AM Performed by: Kyung Rudd Pre-anesthesia Checklist: Patient identified, Emergency Drugs available, Suction available, Patient being monitored and Timeout performed Patient Re-evaluated:Patient Re-evaluated prior to inductionOxygen Delivery Method: Simple face mask Intubation Type: IV induction Placement Confirmation: positive ETCO2

## 2015-07-26 NOTE — Progress Notes (Signed)
ANTICOAGULATION CONSULT NOTE - Initial Consult  Pharmacy Consult for warfarin Indication: VTE prophylaxis  Allergies  Allergen Reactions  . Celebrex [Celecoxib] Other (See Comments)    Gives her a 'really bad headache'  . Other Nausea And Vomiting    Strong pain medications must be taken with nausea medicine     Patient Measurements: Height: 5\' 6"  (167.6 cm) Weight: 170 lb (77.111 kg) IBW/kg (Calculated) : 59.3  Vital Signs: Temp: 97.8 F (36.6 C) (01/06 0936) Temp Source: Oral (01/06 0544) BP: 101/62 mmHg (01/06 1415) Pulse Rate: 60 (01/06 1100)  Labs: No results for input(s): HGB, HCT, PLT, APTT, LABPROT, INR, HEPARINUNFRC, CREATININE, CKTOTAL, CKMB, TROPONINI in the last 72 hours.  Estimated Creatinine Clearance: 74 mL/min (by C-G formula based on Cr of 0.9).   Medical History: Past Medical History  Diagnosis Date  . Hypothyroidism   . GERD (gastroesophageal reflux disease)   . Diabetes mellitus without complication ([place])     diet controlled   Assessment: 66 yof s/p L TKA. No other significant past medical history. Orders to being warfarin with lovenox bridge tonight for DVT px.  Goal of Therapy:  INR 2-3 Monitor platelets by anticoagulation protocol: Yes   Plan:  Warfarin 7.5mg  tonight Daily INR Pharmacy to provide warfarin education   Erin Hearing PharmD., BCPS Clinical Pharmacist Pager [phone] 07/26/2015 3:24 PM

## 2015-07-26 NOTE — Evaluation (Signed)
Physical Therapy Evaluation Patient Details Name: Lori [name] MRN: [id number] DOB: [birth year] Today's Date: 07/26/2015   History of Present Illness  Pt s/p lt TKA. PMH - arthritis, DM  Clinical Impression  Pt admitted with above diagnosis and presents to PT with functional limitations due to deficits listed below (See PT problem list). Pt needs skilled PT to maximize independence and safety to allow discharge to home with husband. Pt active prior to surgery and wants to return to that.     Follow Up Recommendations Home health PT    Equipment Recommendations  Rolling walker with 5" wheels (if husbands old walker can't be adjusted to her size)    Recommendations for Other Services       Precautions / Restrictions Precautions Precautions: Knee Required Braces or Orthoses: Knee Immobilizer - Left Knee Immobilizer - Left: On when out of bed or walking (order didn't specify) Restrictions Weight Bearing Restrictions: Yes LLE Weight Bearing: Weight bearing as tolerated      Mobility  Bed Mobility Overal bed mobility: Needs Assistance Bed Mobility: Supine to Sit;Sit to Supine     Supine to sit: Min guard;HOB elevated Sit to supine: Min assist   General bed mobility comments: Assist to bring lt leg back up into bed returning to supine.Pt initially light headed when she sat up but decr and stabilized after several minutes.  Transfers Overall transfer level: Needs assistance Equipment used: Rolling walker (2 wheeled) Transfers: Sit to/from Stand Sit to Stand: Min guard         General transfer comment: Verbal cues for hand placement and to place LLE out in front prior to sitting  Ambulation/Gait Ambulation/Gait assistance: Min guard Ambulation Distance (Feet): 20 Feet Assistive device: Rolling walker (2 wheeled) Gait Pattern/deviations: Step-through pattern;Decreased step length - left;Decreased stance time - left;Antalgic;Decreased step length - right Gait velocity:  decr Gait velocity interpretation: Below normal speed for age/gender General Gait Details: Pt with nausea which limited distance.   Stairs            Wheelchair Mobility    Modified Rankin (Stroke Patients Only)       Balance Overall balance assessment: No apparent balance deficits (not formally assessed)                                           Pertinent Vitals/Pain Pain Assessment: 0-10 Pain Score: 4  Pain Location: lt knee Pain Descriptors / Indicators: Sore Pain Intervention(s): Limited activity within patient's tolerance;Repositioned;Premedicated before session    Home Living Family/patient expects to be discharged to:: Private residence Living Arrangements: Spouse/significant other Available Help at Discharge: Family;Available 24 hours/day Type of Home: House Home Access: Stairs to enter Entrance Stairs-Rails: Right Entrance Stairs-Number of Steps: 3 Home Layout: Two level;Able to live on main level with bedroom/bathroom Home Equipment: Bedside commode;Walker - 2 wheels;Cane - single point (walker was husband's and may be too tall)      Prior Function Level of Independence: Independent               Hand Dominance        Extremity/Trunk Assessment   Upper Extremity Assessment: Defer to OT evaluation           Lower Extremity Assessment: LLE deficits/detail   LLE Deficits / Details: Pt with fair quad set and unable to perform SLR. Knee ROM - 5-65 active assistive  Communication   Communication: No difficulties  Cognition Arousal/Alertness: Awake/alert Behavior During Therapy: WFL for tasks assessed/performed Overall Cognitive Status: Within Functional Limits for tasks assessed                      General Comments      Exercises Total Joint Exercises Ankle Circles/Pumps: AROM;Left;5 reps;Supine Quad Sets: AROM;Left;5 reps;Supine Goniometric ROM: 5-65      Assessment/Plan    PT Assessment  Patient needs continued PT services  PT Diagnosis Difficulty walking;Acute pain   PT Problem List Decreased strength;Decreased range of motion;Decreased mobility;Decreased activity tolerance;Decreased knowledge of use of DME;Pain  PT Treatment Interventions DME instruction;Gait training;Stair training;Functional mobility training;Therapeutic activities;Therapeutic exercise;Patient/family education   PT Goals (Current goals can be found in the Care Plan section) Acute Rehab PT Goals Patient Stated Goal: Return home PT Goal Formulation: With patient Time For Goal Achievement: 08/02/15 Potential to Achieve Goals: Good    Frequency 7X/week   Barriers to discharge        Co-evaluation               End of Session Equipment Utilized During Treatment: Gait belt;Left knee immobilizer Activity Tolerance: Treatment limited secondary to medical complications (Comment) (nausea) Patient left: in bed;with call bell/phone within reach Nurse Communication: Mobility status;Other (comment) (nausea)         Time: [id number] PT Time Calculation (min) (ACUTE ONLY): 35 min   Charges:   PT Evaluation $PT Eval Moderate Complexity: 1 Procedure PT Treatments $Gait Training: 8-22 mins   PT G Codes:        Jaynie Hitch 08/21/15, 6:41 PM [facility] PT [phone]

## 2015-07-26 NOTE — Transfer of Care (Signed)
Immediate Anesthesia Transfer of Care Note  Patient: Lori [name]  Procedure(s) Performed: Procedure(s): LEFT TOTAL KNEE ARTHROPLASTY (Left)  Patient Location: PACU  Anesthesia Type:Spinal  Level of Consciousness: awake, alert  and oriented  Airway & Oxygen Therapy: Patient Spontanous Breathing and Patient connected to face mask oxygen  Post-op Assessment: Report given to RN and Post -op Vital signs reviewed and stable  Post vital signs: Reviewed and stable  Last Vitals:  Filed Vitals:   07/26/15 0544  BP: 136/74  Pulse: 75  Temp: 37 C  Resp: 18    Complications: No apparent anesthesia complications

## 2015-07-26 NOTE — Brief Op Note (Signed)
07/26/2015  9:41 AM  PATIENT:  Lori [name]  55 y.o. female  PRE-OPERATIVE DIAGNOSIS:  left knee osteoarthritis, primary, end stage  POST-OPERATIVE DIAGNOSIS:  left knee osteoarthritis, primary, end stage  PROCEDURE:  Procedure(s): LEFT TOTAL KNEE ARTHROPLASTY (Left) DePuy Sigma RP  SURGEON:  Surgeon(s) and Role:    * Netta Cedars, MD - Primary  PHYSICIAN ASSISTANT:   ASSISTANTS: Ventura Bruns, PA-C   ANESTHESIA:   regional and spinal  EBL:  Total I/O In: 1000 [I.V.:1000] Out: 150 [Urine:100; Blood:50]  BLOOD ADMINISTERED:none  DRAINS: none   LOCAL MEDICATIONS USED:  none  SPECIMEN:  No Specimen  DISPOSITION OF SPECIMEN:  N/A  COUNTS:  YES  TOURNIQUET:   Total Tourniquet Time Documented: Thigh (Left) - 90 minutes Total: Thigh (Left) - 90 minutes   DICTATION: .Other Dictation: Dictation Number [id number]  PLAN OF CARE: Admit to inpatient   PATIENT DISPOSITION:  PACU - hemodynamically stable.   Delay start of Pharmacological VTE agent (>24hrs) due to surgical blood loss or risk of bleeding: no

## 2015-07-26 NOTE — Discharge Instructions (Signed)
Ice and elevate the knee as much as you can. Weight bearing as tolerated on the left leg.  Keep the incision covered with gel bandage.  Do not get the wound wet for one week, then ok to shower.   Use CPM for 6 hours per day in two hour shifts  0-90 degrees at least, increase as tolerated  Do not prop anything behind the knee, prop under the ankle to encourage knee extension  Follow up in two weeks  [phone]

## 2015-07-26 NOTE — Interval H&P Note (Signed)
History and Physical Interval Note:  07/26/2015 7:27 AM  Lori [name]  has presented today for surgery, with the diagnosis of left knee osteoarthritis  The various methods of treatment have been discussed with the patient and family. After consideration of risks, benefits and other options for treatment, the patient has consented to  Procedure(s): LEFT TOTAL KNEE ARTHROPLASTY (Left) as a surgical intervention .  The patient's history has been reviewed, patient examined, no change in status, stable for surgery.  I have reviewed the patient's chart and labs.  Questions were answered to the patient's satisfaction.     Clark Clowdus,STEVEN R

## 2015-07-26 NOTE — Anesthesia Postprocedure Evaluation (Signed)
Anesthesia Post Note  Patient: Lori [name]  Procedure(s) Performed: Procedure(s) (LRB): LEFT TOTAL KNEE ARTHROPLASTY (Left)  Patient location during evaluation: PACU Anesthesia Type: Spinal and Regional Level of consciousness: oriented and awake and alert Pain management: pain level controlled Vital Signs Assessment: post-procedure vital signs reviewed and stable Respiratory status: spontaneous breathing, respiratory function stable and patient connected to nasal cannula oxygen Cardiovascular status: blood pressure returned to baseline and stable Postop Assessment: no headache and no backache Anesthetic complications: no    Last Vitals:  Filed Vitals:   07/26/15 1045 07/26/15 1100  BP: 107/68 100/64  Pulse: 62 60  Temp:    Resp: 14 13    Last Pain:  Filed Vitals:   07/26/15 1122  PainSc: 0-No pain                 Carmilla Granville,W. EDMOND

## 2015-07-26 NOTE — Op Note (Signed)
Lori [name], Lori [name]                ACCOUNT NO.:  [id number]  MEDICAL RECORD NO.:  [id number]  LOCATION:  [facility]                         FACILITY:  [facility]  PHYSICIAN:  Doran Heater. Veverly Fells, M.D. DATE OF BIRTH:  01-22-1961  DATE OF PROCEDURE:  07/26/2015 DATE OF DISCHARGE:                              OPERATIVE REPORT   PREOPERATIVE DIAGNOSIS:  Left knee end-stage osteoarthritis, primary arthritis.  POSTOPERATIVE DIAGNOSIS:  Left knee end-stage osteoarthritis, primary arthritis.  PROCEDURE PERFORMED:  Left total knee replacement, using DePuy Sigma rotating platform prosthesis.  ATTENDING SURGEON:  Doran Heater. Veverly Fells, M.D.  ASSISTANT:  Charletta Cousin [place], [place], who scrubbed the entire procedure and necessary for satisfactory completion of surgery.  ANESTHESIA:  Spinal anesthesia plus femoral block was used.  ESTIMATED BLOOD LOSS:  Minimal.  FLUID REPLACEMENT:  1200 mL crystalloids.  INSTRUMENT COUNTS:  Correct.  COMPLICATIONS:  There were no complications.  ANTIBIOTICS:  Perioperative antibiotics were given.  INDICATIONS:  The patient is a 55 year old female, with worsening left knee pain, secondary to end-stage arthritis.  The patient has bone-on- bone changes on x-ray.  Has significant issues with mobility, including not being able to walk more than a block without having to be able to sit down and rest her knee.  The patient has significant lateral joint space loss and deformity with a valgus knee.  Having failed all measures of conservative management, including injections, modification activity, anti-inflammatories, the patient presents for a total knee arthroplasty, restore alignment, eliminate arthritis and pain and restore function. Informed consent obtained.  DESCRIPTION OF PROCEDURE:  After an adequate level of anesthesia achieved, the patient was positioned supine in the operating room table. Left leg correctly identified.  Non-sterile tourniquet placed in  the proximal thigh.  The left leg sterilely prepped and draped in usual manner.  Time-out called.  We elevated the leg and exsanguinated using Esmarch bandage.  Tourniquet elevated to 300 mmHg with the knee in flexion.  We made a medial incision, using a 10 blade scalpel. Dissection down through the subcutaneous tissues.  Identified the parapatellar tissues and performed a medial parapatellar arthrotomy.  We everted the patella and divided the lateral patellofemoral ligaments. We then went ahead and entered the distal femur with a step-cut drill. We then inserted our distal femoral intramedullary resection guide set on 3 degrees left, 10 mm resection.  We resected the femur.  There was some hypoplasia in the lateral femoral condyle, but there was still adequate resection on the lateral femoral condyle.  We then sized the femur size 3 anterior down, performed our anterior-posterior chamfer cuts with a formalin block, with special attention towards our rotation. We then resected ACL/PCL remaining meniscal tissues, subluxed the tibia anteriorly and then performed our 90-degree perpendicular tibial cut, with minimal posterior slope of this posterior cruciate substituting prosthesis.  We then sized the tibia to size 3.  We used the lamina spreader and then removed the remaining osteophytes off the posterior femoral condyles and in the notch area and locally capsular release.  We then checked our gaps, which were symmetric at 10 mm.  We then went ahead and completed our tibial preparation, with the modular drill and  keel punch and then also did our box cut for the posterior cruciate substituting femoral prosthesis size 3 tibia, 3 femur, left and we impacted those trials into position.  We placed a 10 poly and snapped it into place, were pleased with our soft tissue balancing.  We then resurfaced the patella going from 22 mm thickness down to 14 mm of thickness between 14 and 15 and then drilled  for the 32 patella.  We placed a 32 patellar button in place, ranged the knee.  We had nice soft tissue balance and patellar tracking, with a no-touch technique.  We removed all trial components, pulse irrigated the knee, dried thoroughly, and cemented all components in place.  The DePuy high viscosity cement.  Once the cement was allowed to harden with the knee in extension with a 10 trial.  We again checked our flexion stability, which was excellent.  We removed the trial component, removed all excess cement with curved osteotome and then placed our real trial after thorough pulse irrigation of the knee.  Once that real 10 mm poly was inserted, we again reduced the knee and had a nice snap on the medial side, had perfect alignment and excellent stability both in flexion, extension, and normal patellar tracking.  We then repaired the parapatellar arthrotomy with interrupted #1 Vicryl suture.  We used a #2 FiberWire suture at the proximal portion of the superior pole area around the distal quad tendon and also the proximal patellar tendon, just distal to the inferior pole of patella just reinforce our repair. There were on the patella itself.  We then were able to close the subcu with 2-0 Vicryl and a 4-0 Monocryl for skin.  Steri-Strips applied followed by sterile dressing.  The patient tolerated the surgery well.     Doran Heater. Veverly Fells, M.D.     SRN/MEDQ  D:  07/26/2015  T:  07/26/2015  Job:  [id number]

## 2015-07-26 NOTE — Progress Notes (Signed)
Orthopedic Tech Progress Note Patient Details:  Lori [name] 02-Apr-1961 [id number]  CPM Left Knee CPM Left Knee: On Left Knee Flexion (Degrees): 90 Left Knee Extension (Degrees): 0 Additional Comments: Trapeze bar and foot roll   [place] Pink 07/26/2015, 11:28 AM

## 2015-07-27 LAB — CBC
HCT: 39.6 % (ref 36.0–46.0)
Hemoglobin: 12.9 g/dL (ref 12.0–15.0)
MCH: 30.7 pg (ref 26.0–34.0)
MCHC: 32.6 g/dL (ref 30.0–36.0)
MCV: 94.3 fL (ref 78.0–100.0)
PLATELETS: 166 10*3/uL (ref 150–400)
RBC: 4.2 MIL/uL (ref 3.87–5.11)
RDW: 13.6 % (ref 11.5–15.5)
WBC: 8.6 10*3/uL (ref 4.0–10.5)

## 2015-07-27 LAB — PROTIME-INR
INR: 1.16 (ref 0.00–1.49)
PROTHROMBIN TIME: 15 s (ref 11.6–15.2)

## 2015-07-27 LAB — BASIC METABOLIC PANEL
Anion gap: 6 (ref 5–15)
BUN: 9 mg/dL (ref 6–20)
CO2: 28 mmol/L (ref 22–32)
Calcium: 8.8 mg/dL — ABNORMAL LOW (ref 8.9–10.3)
Chloride: 104 mmol/L (ref 101–111)
Creatinine, Ser: 0.84 mg/dL (ref 0.44–1.00)
Glucose, Bld: 151 mg/dL — ABNORMAL HIGH (ref 65–99)
POTASSIUM: 4 mmol/L (ref 3.5–5.1)
SODIUM: 138 mmol/L (ref 135–145)

## 2015-07-27 MED ORDER — WARFARIN SODIUM 7.5 MG PO TABS
7.5000 mg | ORAL_TABLET | Freq: Once | ORAL | Status: AC
  Administered 2015-07-27: 7.5 mg via ORAL
  Filled 2015-07-27: qty 1

## 2015-07-27 NOTE — Progress Notes (Signed)
Physical Therapy Treatment Patient Details Name: Lori [name] MRN: [id number] DOB: 11/10/1960 Today's Date: 07/27/2015    History of Present Illness Pt s/p lt TKA. PMH - arthritis, DM    PT Comments    Pt did much better this pm with overall mobility.  Very little active quads on LLE, and with significant extension lag with SLR.     Follow Up Recommendations  Home health PT     Equipment Recommendations  Rolling walker with 5" wheels    Recommendations for Other Services       Precautions / Restrictions Precautions Precautions: Knee Precaution Booklet Issued: Yes (comment) Required Braces or Orthoses: Knee Immobilizer - Left Knee Immobilizer - Left: On when out of bed or walking (order didn't specify) Restrictions Weight Bearing Restrictions: Yes LLE Weight Bearing: Weight bearing as tolerated    Mobility  Bed Mobility Overal bed mobility: Needs Assistance Bed Mobility: Sit to Supine     Supine to sit: Min assist;HOB elevated Sit to supine: Min assist   General bed mobility comments: Assist with LLE  Transfers Overall transfer level: Needs assistance Equipment used: Rolling walker (2 wheeled) Transfers: Sit to/from Stand Sit to Stand: Min guard         General transfer comment: Demonstrated proper technique  Ambulation/Gait Ambulation/Gait assistance: Min guard Ambulation Distance (Feet): 80 Feet Assistive device: Rolling walker (2 wheeled) Gait Pattern/deviations: Step-to pattern;Decreased stride length Gait velocity: improved greatly from AM session Gait velocity interpretation: Below normal speed for age/gender General Gait Details: Pt did not report being lightheaded this session and did not need to take standing rest breaks when ambulating.  Required cues to not step too close to front of walker and to put weight through leg.  Heel strike was encouraged    Stairs            Wheelchair Mobility    Modified Rankin (Stroke Patients Only)        Balance                                    Cognition Arousal/Alertness: Awake/alert Behavior During Therapy: WFL for tasks assessed/performed Overall Cognitive Status: Within Functional Limits for tasks assessed                      Exercises Total Joint Exercises Ankle Circles/Pumps: AROM;Left;Supine;10 reps (limite ROM in Left ankle DF) Quad Sets: AROM;Left;10 reps;Supine Heel Slides: AAROM;Left;10 reps;Supine Hip ABduction/ADduction: AAROM;Left;10 reps;Supine Straight Leg Raises: AAROM;Left;10 reps;Supine Long Arc Quad: AAROM;Left;10 reps;Seated Knee Flexion: AAROM;Left;5 reps;Seated Goniometric ROM: 79 degees flexion.  Grossly lacking 5 degrees extension    General Comments General comments (skin integrity, edema, etc.): Pt reports pain meds make her nauseated and nausea meds make her sleepy.        Pertinent Vitals/Pain Pain Assessment: 0-10 Pain Score: 3  Faces Pain Scale: Hurts even more Pain Location: Left Calf and ankle Pain Descriptors / Indicators: Cramping;Spasm Pain Intervention(s): Limited activity within patient's tolerance;Premedicated before session    Home Living    Prior Function Level of Independence: Independent      Comments: Pt and husband are retired   PT Goals (current goals can now be found in the care plan section) Acute Rehab PT Goals Patient Stated Goal: be able [place] do [place] and exercise Progress towards PT goals: Progressing toward goals    Frequency  7X/week  PT Plan Current plan remains appropriate    Co-evaluation             End of Session Equipment Utilized During Treatment: Gait belt;Left knee immobilizer Activity Tolerance: Patient tolerated treatment well Patient left: with call bell/phone within reach;in bed;in CPM     Time: 1340-1413 PT Time Calculation (min) (ACUTE ONLY): 33 min  Charges:  $Gait Training: 8-22 mins $Therapeutic Exercise: 8-22 mins                     G Codes:      Lori [name] 2015-08-19, 2:58 PM Lori [name], Lori [name] 08/19/15

## 2015-07-27 NOTE — Progress Notes (Signed)
[place] for warfarin Indication: VTE prophylaxis  Allergies  Allergen Reactions  . Celebrex [Celecoxib] Other (See Comments)    Gives her a 'really bad headache'  . Other Nausea And Vomiting    Strong pain medications must be taken with nausea medicine     Patient Measurements: Height: 5\' 6"  (167.6 cm) Weight: 170 lb (77.111 kg) IBW/kg (Calculated) : 59.3  Vital Signs: Temp: 99.3 F (37.4 C) (01/07 0442) Temp Source: Oral (01/07 0442) BP: 122/65 mmHg (01/07 0442) Pulse Rate: 103 (01/07 0442)  Labs:  Recent Labs  07/26/15 1645 07/27/15 0441  HGB 13.1 12.9  HCT 39.5 39.6  PLT 158 166  LABPROT  --  15.0  INR  --  1.16  CREATININE 0.86 0.84    Estimated Creatinine Clearance: 79.3 mL/min (by C-G formula based on Cr of 0.84).  Assessment: 22 yof s/p L TKA 1/6. No other significant past medical history. 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  INR 1.16. Hgb 12.9, plts 166- stable, no bleeding noted.  Goal of Therapy:  INR 2-3 Monitor platelets by anticoagulation protocol: Yes   Plan:  -Warfarin 7.5mg  tonight -Daily INR -Pharmacy to provide warfarin education- note she is on multiple supplements PTA which have not been continued here, will discuss how these may affect INR with patient.  Frances Joynt D. Sharan Mcenaney, PharmD, BCPS Clinical Pharmacist Pager: [phone] 07/27/2015 11:50 AM

## 2015-07-27 NOTE — Progress Notes (Signed)
   Subjective: 1 Day Post-Op Procedure(s) (LRB): LEFT TOTAL KNEE ARTHROPLASTY (Left)  C/o moderate pain/soreness in the knee today Denies any new symptoms Actually did some walking yesterday Otherwise doing fairly well Patient reports pain as moderate.  Objective:   VITALS:   Filed Vitals:   07/27/15 0011 07/27/15 0442  BP: 129/70 122/65  Pulse: 102 103  Temp: 99.3 F (37.4 C) 99.3 F (37.4 C)  Resp: 15 16    Left knee ace in place nv intact distally No rashes or edema  LABS  Recent Labs  07/26/15 1645 07/27/15 0441  HGB 13.1 12.9  HCT 39.5 39.6  WBC 8.6 8.6  PLT 158 166     Recent Labs  07/26/15 1645 07/27/15 0441  NA  --  138  K  --  4.0  BUN  --  9  CREATININE 0.86 0.84  GLUCOSE  --  151*     Assessment/Plan: 1 Day Post-Op Procedure(s) (LRB): LEFT TOTAL KNEE ARTHROPLASTY (Left) PT/OT Plan for d/c either tomorrow vs Monday Patient in agreement Pain management as needee    Merla Riches, MPAS, PA-C  07/27/2015, 7:48 AM

## 2015-07-27 NOTE — Evaluation (Signed)
Occupational Therapy Evaluation and Discharge Patient Details Name: Lori [name] MRN: [id number] DOB: [birth year] Today's Date: 07/27/2015    History of Present Illness Pt s/p lt TKA. PMH - arthritis, DM   Clinical Impression   Pt was independent in ADL and mobility prior to admission.  Presents somewhat lethargic, likely a result of medication for nausea. Pt requiring min guard assist for ADL transfers with RW.  Educated pt in use of AE and technique for LB bathing and dressing, with pt choosing to rely on her husband's assistance until she is able to perform on her own. Instructed in fall prevention, transporting items with walker and safe footwear at home.  Pt verbally confirming understanding of all education.  No further OT needs.    Follow Up Recommendations  No OT follow up    Equipment Recommendations  None recommended by OT    Recommendations for Other Services       Precautions / Restrictions Precautions Precautions: Knee;Fall Required Braces or Orthoses: Knee Immobilizer - Left Knee Immobilizer - Left: On when out of bed or walking (order not specific) Restrictions Weight Bearing Restrictions: Yes LLE Weight Bearing: Weight bearing as tolerated      Mobility Bed Mobility               General bed mobility comments: pt in chair, returned to chair for lunch  Transfers Overall transfer level: Needs assistance Equipment used: Rolling walker (2 wheeled) Transfers: Sit to/from Stand Sit to Stand: Min guard         General transfer comment: verbal cues for technique    Balance                                            ADL Overall ADL's : Needs assistance/impaired Eating/Feeding: Independent;Sitting   Grooming: Wash/dry hands;Standing;Min guard   Upper Body Bathing: Set up;Sitting   Lower Body Bathing: Minimal assistance;Sit to/from stand Lower Body Bathing Details (indicate cue type and reason): recommended long handled bath  sponge Upper Body Dressing : Set up;Sitting   Lower Body Dressing: Minimal assistance;Sit to/from stand Lower Body Dressing Details (indicate cue type and reason): demonstrated use of reacher, sock aid, and long shoe horn, educated in safe footwear Toilet Transfer: Min guard;Ambulation;Comfort height toilet;RW   Toileting- Water quality scientist and Hygiene: Min guard;Sit to/from stand   Tub/ Shower Transfer: Min guard;Ambulation;Rolling walker Tub/Shower Transfer Details (indicate cue type and reason): simulated over threshold of bathroom Functional mobility during ADLs: Min guard;Rolling walker General ADL Comments: Pt experiencing nausea with medication contributing to lethargy with pt frequently closing her eyes. However, pt able to verbally teach back education.     Vision     Perception     Praxis      Pertinent Vitals/Pain Pain Assessment: Faces Faces Pain Scale: Hurts even more Pain Location: L LE Pain Descriptors / Indicators: Guarding;Grimacing Pain Intervention(s): Limited activity within patient's tolerance;Monitored during session;Premedicated before session;Repositioned     Hand Dominance Right   Extremity/Trunk Assessment Upper Extremity Assessment Upper Extremity Assessment: Overall WFL for tasks assessed   Lower Extremity Assessment Lower Extremity Assessment: Defer to PT evaluation       Communication Communication Communication: No difficulties   Cognition Arousal/Alertness: Lethargic;Suspect due to medications Behavior During Therapy: Flat affect Overall Cognitive Status: Within Functional Limits for tasks assessed  General Comments       Exercises       Shoulder Instructions      Home Living Family/patient expects to be discharged to:: Private residence Living Arrangements: Spouse/significant other Available Help at Discharge: Family;Available 24 hours/day Type of Home: House Home Access: Stairs to  enter [employer] of Steps: 3 Entrance Stairs-Rails: Right Home Layout: Two level;Able to live on main level with bedroom/bathroom Alternate Level Stairs-Number of Steps: flight   Bathroom Shower/Tub: Occupational psychologist: Handicapped height     Home Equipment: Bedside commode;Walker - 2 wheels;Cane - single point;Shower seat - built in          Prior Functioning/Environment Level of Independence: Independent        Comments: Pt and husband are retired    OT Diagnosis: Generalized weakness;Acute pain   OT Problem List:     OT Treatment/Interventions:      OT Goals(Current goals can be found in the care plan section) Acute Rehab OT Goals Patient Stated Goal: Return home  OT Frequency:     Barriers to D/C:            Co-evaluation              End of Session Equipment Utilized During Treatment: Rolling walker;Gait belt;Left knee immobilizer  Activity Tolerance: Treatment limited secondary to medical complications (Comment) (nausea) Patient left: in chair;with call bell/phone within reach   Time: 1212-1229 OT Time Calculation (min): 17 min Charges:  OT General Charges $OT Visit: 1 Procedure OT Evaluation $OT Eval Low Complexity: 1 Procedure G-Codes:    Malka So 07/27/2015, 2:36 PM [phone]

## 2015-07-27 NOTE — Progress Notes (Addendum)
Physical Therapy Treatment Patient Details Name: Lori [name] MRN: [id number] DOB: May 11, 1961 Today's Date: 07/27/2015    History of Present Illness Pt s/p lt TKA. PMH - arthritis, DM    PT Comments    Pt limited by calf and ankle pain (8/10) as well as nausea and lightheadedness.     Follow Up Recommendations  Home health PT     Equipment Recommendations  Rolling walker with 5" wheels (if husbands old walker can't be adjusted to her size)    Recommendations for Other Services       Precautions / Restrictions Precautions Precautions: Knee;Fall Precaution Booklet Issued: Yes (comment) Required Braces or Orthoses: Knee Immobilizer - Left Knee Immobilizer - Left: On when out of bed or walking (order not specific) Restrictions Weight Bearing Restrictions: Yes LLE Weight Bearing: Weight bearing as tolerated    Mobility  Bed Mobility Overal bed mobility: Needs Assistance Bed Mobility: Supine to Sit     Supine to sit: Min assist;HOB elevated     General bed mobility comments: pt in chair, returned to chair for lunch  Transfers Overall transfer level: Needs assistance Equipment used: Rolling walker (2 wheeled) Transfers: Sit to/from Stand Sit to Stand: Min guard         General transfer comment: verbal cues for technique  Ambulation/Gait Ambulation/Gait assistance: Min assist Ambulation Distance (Feet): 40 Feet Assistive device: Rolling walker (2 wheeled) Gait Pattern/deviations: Step-to pattern;Decreased stride length Gait velocity: decr Gait velocity interpretation: Below normal speed for age/gender General Gait Details: Pt with nausea/lightheadedness which limited distance.  Pt needed cues to not step feet too close to front of walker. Pt took multiple standing rest breaks due to nausea    Stairs            Wheelchair Mobility    Modified Rankin (Stroke Patients Only)       Balance                                     Cognition Arousal/Alertness: Lethargic;Suspect due to medications Behavior During Therapy: Flat affect Overall Cognitive Status: Within Functional Limits for tasks assessed                      Exercises Total Joint Exercises Ankle Circles/Pumps: AROM;Left;5 reps;Supine    General Comments General comments (skin integrity, edema, etc.): Pt reports pain meds make her nauseated and nausea meds make her sleepy.        Pertinent Vitals/Pain Pain Assessment: Faces Pain Score: 8  Faces Pain Scale: Hurts even more Pain Location: L LE Pain Descriptors / Indicators: Guarding;Grimacing Pain Intervention(s): Limited activity within patient's tolerance;Monitored during session;Premedicated before session;Repositioned       Prior Function Level of Independence: Independent      Comments: Pt and husband are retired   PT Goals (current goals can now be found in the care plan section) Acute Rehab PT Goals Patient Stated Goal: Return home Progress towards PT goals: Progressing toward goals    Frequency  7X/week    PT Plan Current plan remains appropriate    Co-evaluation             End of Session Equipment Utilized During Treatment: Gait belt;Left knee immobilizer Activity Tolerance: Treatment limited secondary to medical complications (Comment);Patient limited by pain;Other (comment) (nausea.  Did not perform exercises due to 8/10 pain ) Patient left: with call bell/phone within reach;in  chair     Time: [id number] PT Time Calculation (min) (ACUTE ONLY): 27 min  Charges:  $Gait Training: 8-22 mins $Therapeutic Exercise: 8-22 mins                    G Codes:      Melvern Banker 07/31/15, 2:46 PM  Lavonia Dana, PT  [phone] 31-Jul-2015

## 2015-07-28 LAB — CBC
HEMATOCRIT: 38 % (ref 36.0–46.0)
HEMOGLOBIN: 12.4 g/dL (ref 12.0–15.0)
MCH: 30.8 pg (ref 26.0–34.0)
MCHC: 32.6 g/dL (ref 30.0–36.0)
MCV: 94.3 fL (ref 78.0–100.0)
Platelets: 151 10*3/uL (ref 150–400)
RBC: 4.03 MIL/uL (ref 3.87–5.11)
RDW: 13.3 % (ref 11.5–15.5)
WBC: 8.7 10*3/uL (ref 4.0–10.5)

## 2015-07-28 LAB — PROTIME-INR
INR: 1.7 — AB (ref 0.00–1.49)
PROTHROMBIN TIME: 20 s — AB (ref 11.6–15.2)

## 2015-07-28 MED ORDER — WARFARIN SODIUM 5 MG PO TABS
5.0000 mg | ORAL_TABLET | Freq: Once | ORAL | Status: AC
  Administered 2015-07-28: 5 mg via ORAL
  Filled 2015-07-28: qty 1

## 2015-07-28 NOTE — Progress Notes (Signed)
[place] for warfarin Indication: VTE prophylaxis  Allergies  Allergen Reactions  . Celebrex [Celecoxib] Other (See Comments)    Gives her a 'really bad headache'  . Other Nausea And Vomiting    Strong pain medications must be taken with nausea medicine     Patient Measurements: Height: 5\' 6"  (167.6 cm) Weight: 170 lb (77.111 kg) IBW/kg (Calculated) : 59.3  Vital Signs: Temp: 99.4 F (37.4 C) (01/08 0544) BP: 127/65 mmHg (01/08 0544) Pulse Rate: 100 (01/08 0544)  Labs:  Recent Labs  07/26/15 1645 07/27/15 0441 07/28/15 0314 07/28/15 0550  HGB 13.1 12.9 12.4  --   HCT 39.5 39.6 38.0  --   PLT 158 166 151  --   LABPROT  --  15.0  --  20.0*  INR  --  1.16  --  1.70*  CREATININE 0.86 0.84  --   --     Estimated Creatinine Clearance: 79.3 mL/min (by C-G formula based on Cr of 0.84).  Assessment: 24 yof s/p L TKA 1/6. No other significant past medical history. 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  INR 1.16>1.7 after 2 doses of warfarin 7.5mg .  Continues on Lovenox 30mg  subQ q12h.  Hgb 12.4, plts 151- stable, no bleeding noted.  Goal of Therapy:  INR 2-3 Monitor platelets by anticoagulation protocol: Yes   Plan:  -Warfarin 5mg  tonight -Daily INR -Pharmacy to provide warfarin education- note she is on multiple supplements PTA which have not been continued here, will discuss how these may affect INR with patient.  Euclide Granito D. Jirah Rider, PharmD, BCPS Clinical Pharmacist Pager: [phone] 07/28/2015 12:20 PM

## 2015-07-28 NOTE — Care Management Note (Signed)
Case Management Note  Patient Details  Name: Lori [name] MRN: [id number] Date of Birth: April 12, 1961  Subjective/Objective:   55 yr old female s/p left total knee arthroplasty.                 Action/Plan: Case manager spoke with patient concerning discharge plan and DME needs. Choice was offered. Patient was preoperatively setup with [facility], no changes. Patient states she has a 3in1 but will need walker. Case manager will order. Patient will have family support at discharge.     Expected Discharge Date:   07/28/15               Expected Discharge Plan:   Home with Home Helath  In-House Referral:     Discharge planning Services  CM Consult  Post Acute Care Choice:  Durable Medical Equipment, Home Health Choice offered to:  Patient  DME Arranged:  [facility] Rile DME Agency:  [place] Arranged:  RN, PT [facility] Agency:     Status of Service:  Completed, signed off  Medicare Important Message Given:    Date Medicare IM Given:    Medicare IM give by:    Date Additional Medicare IM Given:    Additional Medicare Important Message give by:     If discussed at [place] of Stay Meetings, dates discussed:    Additional Comments:  Jin, [place], RN 07/28/2015, 1:08 PM

## 2015-07-28 NOTE — Progress Notes (Signed)
Physical Therapy Treatment Patient Details Name: Lori [name] MRN: [id number] DOB: [birth year] Today's Date: 07/28/2015    History of Present Illness Pt s/p lt TKA. PMH - arthritis, DM    PT Comments    Pt remains limited by pain and intermittent lightheadedness, not able to attempt stairs this session.  PA in to change dressing with minimal improvement noted in LLE quad activation or SLR strength with persistent lack of meaningful contraction and SLR lag.  KI donned for ambulation.  Attempt 3 steps next session/date as able with goal to d/c to home environment.   Follow Up Recommendations  Home health PT     Equipment Recommendations  Rolling walker with 5" wheels    Recommendations for Other Services       Precautions / Restrictions Precautions Precautions: Knee Precaution Booklet Issued: Yes (comment) Precaution Comments: instructed on reason for KI Required Braces or Orthoses: Knee Immobilizer - Left Knee Immobilizer - Left: On when out of bed or walking Restrictions LLE Weight Bearing: Weight bearing as tolerated Other Position/Activity Restrictions: do not prop left knee on pillows    Mobility  Bed Mobility Overal bed mobility: Needs Assistance Bed Mobility: Sit to Supine     Supine to sit: Min assist;HOB elevated     General bed mobility comments: Assist with LLE  Transfers Overall transfer level: Needs assistance Equipment used: Rolling walker (2 wheeled) Transfers: Sit to/from Stand Sit to Stand: Min guard         General transfer comment: hand placement cues, how to protect operated leg when sitting  Ambulation/Gait Ambulation/Gait assistance: Min assist Ambulation Distance (Feet): 60 Feet Assistive device: Rolling walker (2 wheeled) Gait Pattern/deviations: Step-to pattern;Decreased stance time - left;Decreased step length - right;Decreased weight shift to left;Antalgic;Trunk flexed   Gait velocity interpretation: Below normal speed for  age/gender General Gait Details: more lightheaded this session vs. yesterday but minimally limiting; pt needs frequent verbal/visual cues for gait pattern adjustment as she is moving walker and left leg simultaneously small step, then barely shuffling right foot forward but cannot/willnot bring right foot even with or past left foot. 3 standing breaks, cannot make it down hall toward steps, stairs not attempted this date   Stairs            Wheelchair Mobility    Modified Rankin (Stroke Patients Only)       Balance                                    Cognition Arousal/Alertness: Awake/alert Behavior During Therapy: WFL for tasks assessed/performed;Flat affect Overall Cognitive Status: Within Functional Limits for tasks assessed                      Exercises Total Joint Exercises Ankle Circles/Pumps: AROM;Left;Supine;10 reps Quad Sets: AROM;Left;10 reps;Supine Gluteal Sets: AROM;Both;5 reps;Supine;Seated Short Arc Quad: Left;5 reps;PROM Heel Slides: AAROM;Left;10 reps;Supine Hip ABduction/ADduction: AAROM;Left;10 reps;Supine Straight Leg Raises: AAROM;Left;10 reps;Supine    General Comments        Pertinent Vitals/Pain Pain Assessment: 0-10 Pain Score: 7  Pain Location: left knee Pain Intervention(s): Limited activity within patient's tolerance;Patient requesting pain meds-RN notified;Repositioned    Home Living                      Prior Function            PT Goals (current goals can  now be found in the care plan section) Acute Rehab PT Goals PT Goal Formulation: With patient Potential to Achieve Goals: Good Progress towards PT goals: Progressing toward goals    Frequency  7X/week    PT Plan Current plan remains appropriate    Co-evaluation             End of Session Equipment Utilized During Treatment: Gait belt;Left knee immobilizer Activity Tolerance: Patient tolerated treatment well Patient left: in  chair;with call bell/phone within reach;with nursing/sitter in room     Time: [id number] PT Time Calculation (min) (ACUTE ONLY): 32 min  Charges:  $Gait Training: 8-22 mins $Therapeutic Exercise: 8-22 mins                    G Codes:      Herbie Drape 07/28/2015, 11:25 AM

## 2015-07-28 NOTE — Progress Notes (Signed)
Subjective: 2 Days Post-Op Procedure(s) (LRB): LEFT TOTAL KNEE ARTHROPLASTY (Left) Patient reports pain as moderate to Left knee.  Radiating soreness in the LLE. Tolerating Po's. Progressing with PT. Denies SOB, CP, or calf pain.  Objective: Vital signs in last 24 hours: Temp:  [99.4 F (37.4 C)-100.4 F (38 C)] 99.4 F (37.4 C) (01/08 0544) Pulse Rate:  [100-104] 100 (01/08 0544) Resp:  [15-16] 15 (01/08 0544) BP: (127-135)/(65-67) 127/65 mmHg (01/08 0544) SpO2:  [92 %-96 %] 96 % (01/08 0544)  Intake/Output from previous day: 01/07 0701 - 01/08 0700 In: 360 [P.O.:360] Out: -  Intake/Output this shift: Total I/O In: 120 [P.O.:120] Out: -    Recent Labs  07/26/15 1645 07/27/15 0441 07/28/15 0314  HGB 13.1 12.9 12.4    Recent Labs  07/27/15 0441 07/28/15 0314  WBC 8.6 8.7  RBC 4.20 4.03  HCT 39.6 38.0  PLT 166 151    Recent Labs  07/26/15 1645 07/27/15 0441  NA  --  138  K  --  4.0  CL  --  104  CO2  --  28  BUN  --  9  CREATININE 0.86 0.84  GLUCOSE  --  151*  CALCIUM  --  8.8*    Recent Labs  07/27/15 0441 07/28/15 0550  INR 1.16 1.70*    Alert and oriented x3. RRR, Lungs clear, BS x4. Left Calf soft and non tender. L knee dressing changed and is C/D/I. No DVT signs. No signs of infection or compartment syndrome. LLE grossly neurovascularly intact.   Assessment/Plan: 2 Days Post-Op Procedure(s) (LRB): LEFT TOTAL KNEE ARTHROPLASTY (Left) Dressing changed Up with PT Plan to D/c home tomorrow Continue current care On Coumadin For DVt prophylaxis    Kyheem Bathgate L 07/28/2015, 10:57 AM

## 2015-07-28 NOTE — Progress Notes (Addendum)
Phone call from lab. Pt INR was greater than 10 and PTt was greater than 90. Requested that lab redraw these labs. Pharmacy notified.

## 2015-07-28 NOTE — Progress Notes (Signed)
Orthopedic Tech Progress Note Patient Details:  Lori [name] 1961-02-12 [id number]  Patient ID: Lori [name], female   DOB: 10-15-1960, 54 y.o.   MRN: [id number] Placed pt's lle on cpm @ 0-60 degrees @1400   Hildred Priest 07/28/2015, 1:59 PM

## 2015-07-29 ENCOUNTER — Encounter (HOSPITAL_COMMUNITY): Payer: Self-pay | Admitting: Orthopedic Surgery

## 2015-07-29 LAB — CBC
HCT: 35.5 % — ABNORMAL LOW (ref 36.0–46.0)
Hemoglobin: 11.8 g/dL — ABNORMAL LOW (ref 12.0–15.0)
MCH: 30.7 pg (ref 26.0–34.0)
MCHC: 33.2 g/dL (ref 30.0–36.0)
MCV: 92.4 fL (ref 78.0–100.0)
PLATELETS: 155 10*3/uL (ref 150–400)
RBC: 3.84 MIL/uL — AB (ref 3.87–5.11)
RDW: 13.3 % (ref 11.5–15.5)
WBC: 9 10*3/uL (ref 4.0–10.5)

## 2015-07-29 LAB — PROTIME-INR
INR: 1.71 — AB (ref 0.00–1.49)
PROTHROMBIN TIME: 20.1 s — AB (ref 11.6–15.2)

## 2015-07-29 MED ORDER — PROMETHAZINE HCL 25 MG PO TABS: 25.0000 mg | ORAL_TABLET | Freq: Four times a day (QID) | ORAL | Status: DC | PRN

## 2015-07-29 NOTE — Progress Notes (Signed)
Orthopedics Progress Note  Subjective: Stable overnight. Some nausea yesterday  Objective:  Filed Vitals:   07/28/15 2011 07/29/15 0447  BP: 134/72 128/65  Pulse: 110 100  Temp: 99.6 F (37.6 C) 98.9 F (37.2 C)  Resp: 15 16    General: Awake and alert  Musculoskeletal: knee incision clean and dry and intact, mod swelling, no cords, neg homan's Neurovascularly intact  Lab Results  Component Value Date   WBC 9.0 07/29/2015   HGB 11.8* 07/29/2015   HCT 35.5* 07/29/2015   MCV 92.4 07/29/2015   PLT 155 07/29/2015       Component Value Date/Time   NA 138 07/27/2015 0441   K 4.0 07/27/2015 0441   CL 104 07/27/2015 0441   CO2 28 07/27/2015 0441   GLUCOSE 151* 07/27/2015 0441   BUN 9 07/27/2015 0441   CREATININE 0.84 07/27/2015 0441   CALCIUM 8.8* 07/27/2015 0441   GFRNONAA >60 07/27/2015 0441   GFRAA >60 07/27/2015 0441    Lab Results  Component Value Date   INR 1.71* 07/29/2015   INR 1.70* 07/28/2015   INR 1.16 07/27/2015    Assessment/Plan: POD #3s/p Procedure(s): LEFT TOTAL KNEE ARTHROPLASTY D/C home after PT  Remo Lipps R. Veverly Fells, MD 07/29/2015 7:23 AM

## 2015-07-29 NOTE — Discharge Summary (Signed)
Physician Discharge Summary   Patient ID: Lori [name] MRN: [id number] DOB/AGE: [birth year] 55 y.o.  Admit date: 07/26/2015 Discharge date: 07/29/2015  Admission Diagnoses:  Active Problems:   H/O total knee replacement   Discharge Diagnoses:  Same   Surgeries: Procedure(s): LEFT TOTAL KNEE ARTHROPLASTY on 07/26/2015   Consultants: PT, OT  Discharged Condition: Stable  Hospital Course: Lori [name] is an 55 y.o. female who was admitted 07/26/2015 with a chief complaint of left knee pain, and found to have a diagnosis of left knee primary osteoarthritis, end stage.  They were brought to the operating room on 07/26/2015 and underwent the above named procedures.    The patient had an uncomplicated hospital course and was stable for discharge.  Recent vital signs:  Filed Vitals:   07/28/15 2011 07/29/15 0447  BP: 134/72 128/65  Pulse: 110 100  Temp: 99.6 F (37.6 C) 98.9 F (37.2 C)  Resp: 15 16    Recent laboratory studies:  Results for orders placed or performed during the hospital encounter of 07/26/15  Glucose, capillary  Result Value Ref Range   Glucose-Capillary 86 65 - 99 mg/dL   Comment 1 Notify RN    Comment 2 Document in Chart   Glucose, capillary  Result Value Ref Range   Glucose-Capillary 97 65 - 99 mg/dL  CBC  Result Value Ref Range   WBC 8.6 4.0 - 10.5 K/uL   RBC 4.23 3.87 - 5.11 MIL/uL   Hemoglobin 13.1 12.0 - 15.0 g/dL   HCT 39.5 36.0 - 46.0 %   MCV 93.4 78.0 - 100.0 fL   MCH 31.0 26.0 - 34.0 pg   MCHC 33.2 30.0 - 36.0 g/dL   RDW 13.6 11.5 - 15.5 %   Platelets 158 150 - 400 K/uL  Creatinine, serum  Result Value Ref Range   Creatinine, Ser 0.86 0.44 - 1.00 mg/dL   GFR calc non Af Amer >60 >60 mL/min   GFR calc Af Amer >60 >60 mL/min  Protime-INR  Result Value Ref Range   Prothrombin Time 15.0 11.6 - 15.2 seconds   INR 1.16 0.00 - 1.49  CBC  Result Value Ref Range   WBC 8.6 4.0 - 10.5 K/uL   RBC 4.20 3.87 - 5.11 MIL/uL   Hemoglobin 12.9  12.0 - 15.0 g/dL   HCT 39.6 36.0 - 46.0 %   MCV 94.3 78.0 - 100.0 fL   MCH 30.7 26.0 - 34.0 pg   MCHC 32.6 30.0 - 36.0 g/dL   RDW 13.6 11.5 - 15.5 %   Platelets 166 150 - 400 K/uL  Basic metabolic panel  Result Value Ref Range   Sodium 138 135 - 145 mmol/L   Potassium 4.0 3.5 - 5.1 mmol/L   Chloride 104 101 - 111 mmol/L   CO2 28 22 - 32 mmol/L   Glucose, Bld 151 (H) 65 - 99 mg/dL   BUN 9 6 - 20 mg/dL   Creatinine, Ser 0.84 0.44 - 1.00 mg/dL   Calcium 8.8 (L) 8.9 - 10.3 mg/dL   GFR calc non Af Amer >60 >60 mL/min   GFR calc Af Amer >60 >60 mL/min   Anion gap 6 5 - 15  CBC  Result Value Ref Range   WBC 8.7 4.0 - 10.5 K/uL   RBC 4.03 3.87 - 5.11 MIL/uL   Hemoglobin 12.4 12.0 - 15.0 g/dL   HCT 38.0 36.0 - 46.0 %   MCV 94.3 78.0 - 100.0 fL   MCH  30.8 26.0 - 34.0 pg   MCHC 32.6 30.0 - 36.0 g/dL   RDW 13.3 11.5 - 15.5 %   Platelets 151 150 - 400 K/uL  Protime-INR  Result Value Ref Range   Prothrombin Time 20.0 (H) 11.6 - 15.2 seconds   INR 1.70 (H) 0.00 - 1.49  Protime-INR  Result Value Ref Range   Prothrombin Time 20.1 (H) 11.6 - 15.2 seconds   INR 1.71 (H) 0.00 - 1.49  CBC  Result Value Ref Range   WBC 9.0 4.0 - 10.5 K/uL   RBC 3.84 (L) 3.87 - 5.11 MIL/uL   Hemoglobin 11.8 (L) 12.0 - 15.0 g/dL   HCT 35.5 (L) 36.0 - 46.0 %   MCV 92.4 78.0 - 100.0 fL   MCH 30.7 26.0 - 34.0 pg   MCHC 33.2 30.0 - 36.0 g/dL   RDW 13.3 11.5 - 15.5 %   Platelets 155 150 - 400 K/uL    Discharge Medications:     Medication List    STOP taking these medications        ALEVE 220 MG tablet  Generic drug:  naproxen sodium      TAKE these medications        aspirin-acetaminophen-caffeine 250-250-65 MG tablet  Commonly known as:  EXCEDRIN MIGRAINE  Take 2 tablets by mouth every 6 (six) hours as needed for migraine.     atorvastatin 10 MG tablet  Commonly known as:  LIPITOR  Take 10 mg by mouth daily.     bimatoprost 0.01 % Soln  Commonly known as:  LUMIGAN  Place 1 drop into both  eyes at bedtime.     COQ10 PO  Take 1 capsule by mouth every morning.     FIBER THERAPY PO  Take 1 Can by mouth at bedtime.     levothyroxine 50 MCG tablet  Commonly known as:  SYNTHROID, LEVOTHROID  Take 50 mcg by mouth daily before breakfast.     methocarbamol 500 MG tablet  Commonly known as:  ROBAXIN  Take 1 tablet (500 mg total) by mouth 3 (three) times daily as needed.     montelukast 10 MG tablet  Commonly known as:  SINGULAIR  Take 10 mg by mouth at bedtime.     multivitamin with minerals tablet  Take 1 tablet by mouth daily.     ICAPS AREDS 2 Caps  Take 1 capsule by mouth 2 (two) times daily.     OMEGA-3 COMPLEX PO  Take 1 capsule by mouth 2 (two) times daily.     OSTEO BI-FLEX ADV DOUBLE ST Tabs  Take 2 tablets by mouth every morning.     oxyCODONE-acetaminophen 5-325 MG tablet  Commonly known as:  ROXICET  Take 1-2 tablets by mouth every 4 (four) hours as needed for severe pain.     promethazine 25 MG tablet  Commonly known as:  PHENERGAN  Take 1 tablet (25 mg total) by mouth every 6 (six) hours as needed for nausea or vomiting.     SAFFLOWER OIL PO  Take 1 capsule by mouth 3 (three) times daily.     warfarin 5 MG tablet  Commonly known as:  COUMADIN  Take 1 tablet (5 mg total) by mouth daily. Take as directed per the pharmacist for INR target from 2.5-3.0 for 30 days post op        Diagnostic Studies: Dg Knee Left Port  07/26/2015  CLINICAL DATA:  Total knee replacement on the left. EXAM: PORTABLE LEFT  KNEE - 1-2 VIEW COMPARISON:  None. FINDINGS: Changes of left knee replacement. No hardware or bony complicating feature. Soft tissue and joint space gas. IMPRESSION: Left knee replacement without complicating feature. Electronically Signed   By: Rolm Baptise M.D.   On: 07/26/2015 10:38    Disposition: home        Follow-up Information    Follow up with Kimothy Kishimoto,STEVEN R, MD. Call in 2 weeks.   Specialty:  Orthopedic Surgery   Why:  [phone]    Contact information:   [address] [place] [address] [phone]       Follow up with [facility].   Why:  Someone from [facility] will contact you concerning start date for therapy.   Contact information:   [address] [place] [address] [phone]        Signed: Augustin Schooling 07/29/2015, 7:25 AM

## 2015-07-29 NOTE — Progress Notes (Signed)
Physical Therapy Treatment Patient Details Name: Lori [name] MRN: [id number] DOB: 03/10/1961 Today's Date: 07/29/2015    History of Present Illness Pt s/p lt TKA. PMH - arthritis, DM    PT Comments    Pt continues to progress toward mobility goals with overall mobility level of min guard/supervision. Pt tolerated exercises well and with no c/o nausea this session. Patient needs to practice stairs next session.     Follow Up Recommendations  Home health PT     Equipment Recommendations  Rolling walker with 5" wheels    Recommendations for Other Services       Precautions / Restrictions Precautions Precautions: Knee Precaution Booklet Issued: Yes (comment) Precaution Comments: instructed on reason for KI Required Braces or Orthoses: Knee Immobilizer - Left Knee Immobilizer - Left: On when out of bed or walking Restrictions Weight Bearing Restrictions: Yes LLE Weight Bearing: Weight bearing as tolerated Other Position/Activity Restrictions: do not prop left knee on pillows    Mobility  Bed Mobility Overal bed mobility: Needs Assistance Bed Mobility: Supine to Sit     Supine to sit: Min assist     General bed mobility comments: HOB flat and no use of bedrails; min A to keep L LE elevated while pt scooted hips forward to EOB; no physical assist for bringing L LE to EOB or for elevation of trunk  Transfers Overall transfer level: Needs assistance Equipment used: Rolling walker (2 wheeled) Transfers: Sit to/from Stand Sit to Stand: Supervision         General transfer comment: pt with carry over of safe hand placement and with good technique  Ambulation/Gait Ambulation/Gait assistance: Supervision Ambulation Distance (Feet): 150 Feet Assistive device: Rolling walker (2 wheeled) Gait Pattern/deviations: Step-to pattern;Decreased step length - right;Decreased stance time - left;Antalgic   Gait velocity interpretation: Below normal speed for age/gender General  Gait Details: max vc for sequencing and for facilitation of L heel strike; pt with heavy use of bilat UE and decreased L knee flexion during swing phase   Stairs            Wheelchair Mobility    Modified Rankin (Stroke Patients Only)       Balance Overall balance assessment: Needs assistance Sitting-balance support: Feet supported Sitting balance-Leahy Scale: Good     Standing balance support: Single extremity supported Standing balance-Leahy Scale: Fair                      Cognition Arousal/Alertness: Awake/alert Behavior During Therapy: WFL for tasks assessed/performed;Flat affect Overall Cognitive Status: Within Functional Limits for tasks assessed                      Exercises Total Joint Exercises Quad Sets: AROM;Left;10 reps;Supine Heel Slides: AAROM;Left;10 reps;Supine Hip ABduction/ADduction: Left;10 reps;Supine;AROM Straight Leg Raises: AAROM;Left;Supine;5 reps Goniometric ROM: 5-35    General Comments        Pertinent Vitals/Pain Pain Assessment: Faces Faces Pain Scale: Hurts even more (with activity) Pain Location: L knee Pain Descriptors / Indicators: Sore Pain Intervention(s): Limited activity within patient's tolerance;Monitored during session;Premedicated before session;Repositioned    Home Living                      Prior Function            PT Goals (current goals can now be found in the care plan section) Acute Rehab PT Goals Patient Stated Goal: go home PT Goal Formulation:  With patient Potential to Achieve Goals: Good    Frequency  7X/week    PT Plan Current plan remains appropriate    Co-evaluation             End of Session Equipment Utilized During Treatment: Gait belt;Left knee immobilizer Activity Tolerance: Patient tolerated treatment well Patient left: in chair;with call bell/phone within reach     Time: 0936-1004 PT Time Calculation (min) (ACUTE ONLY): 28 min  Charges:   $Gait Training: 8-22 mins $Therapeutic Exercise: 8-22 mins                    G Codes:      Salina April, PTA Pager: [phone]   07/29/2015, 10:30 AM

## 2015-07-29 NOTE — Clinical Social Work Note (Signed)
CSW received referral for SNF.  Case discussed with case manager, and plan is to discharge home.  CSW to sign off please re-consult if social work needs arise.  Amberley Hamler R. Sharissa Brierley, MSW, LCSWA [phone]  

## 2015-07-29 NOTE — Progress Notes (Signed)
Physical Therapy Treatment Patient Details Name: Lori [name] MRN: [id number] DOB: Nov 05, 1960 Today's Date: 07/29/2015    History of Present Illness Pt s/p lt TKA. PMH - arthritis, DM    PT Comments    Patient is making good progress with PT.  Pt received stair training and demonstrated ability to ascend/descend with min guard and no unsteadiness. From a mobility standpoint anticipate patient will be ready for DC home medically ready.     Follow Up Recommendations  Home health PT     Equipment Recommendations  Rolling walker with 5" wheels    Recommendations for Other Services       Precautions / Restrictions Precautions Precautions: Knee Precaution Booklet Issued: Yes (comment) Precaution Comments: instructed on reason for KI Required Braces or Orthoses: Knee Immobilizer - Left Knee Immobilizer - Left: On when out of bed or walking Restrictions Weight Bearing Restrictions: Yes LLE Weight Bearing: Weight bearing as tolerated Other Position/Activity Restrictions: do not prop left knee on pillows    Mobility  Bed Mobility Overal bed mobility: Needs Assistance Bed Mobility: Supine to Sit;Sit to Supine     Supine to sit: Min guard Sit to supine: Min assist   General bed mobility comments: HOB flat and no use of bedrails; min A to elevate L LE into bed  Transfers Overall transfer level: Needs assistance Equipment used: Rolling walker (2 wheeled) Transfers: Sit to/from Stand Sit to Stand: Supervision         General transfer comment: pt with carry over of safe hand placement and with good technique  Ambulation/Gait Ambulation/Gait assistance: Supervision Ambulation Distance (Feet): 50 Feet Assistive device: Rolling walker (2 wheeled) Gait Pattern/deviations: Step-to pattern;Decreased step length - right;Decreased stance time - left;Antalgic   Gait velocity interpretation: Below normal speed for age/gender General Gait Details: vc for sequencing and position  of RW   Stairs Stairs: Yes Stairs assistance: Min guard Stair Management: No rails;Backwards;With walker;One rail Left;Sideways Number of Stairs: 2 (X 2) General stair comments: pt educated on ascend/descend of stairs with use of RW and sideways with use of L rail; pt felt more comfortable with sideways technique; pt with ability to perform both techniques without LOB; min guard for safety; min A for stabilizing RW; pt given handout for use of RW on stairs  Wheelchair Mobility    Modified Rankin (Stroke Patients Only)       Balance Overall balance assessment: Needs assistance Sitting-balance support: Feet supported Sitting balance-Leahy Scale: Good     Standing balance support: Single extremity supported Standing balance-Leahy Scale: Fair                      Cognition Arousal/Alertness: Awake/alert Behavior During Therapy: WFL for tasks assessed/performed;Flat affect Overall Cognitive Status: Within Functional Limits for tasks assessed                      Exercises Total Joint Exercises Quad Sets: AROM;Left;10 reps;Supine Heel Slides: AAROM;Left;10 reps;Supine Hip ABduction/ADduction: Left;10 reps;Supine;AROM Straight Leg Raises: AAROM;Left;Supine;5 reps Goniometric ROM: 5-35    General Comments        Pertinent Vitals/Pain Pain Assessment: Faces Faces Pain Scale: Hurts little more (with activity) Pain Location: L knee Pain Descriptors / Indicators: Sore;Cramping Pain Intervention(s): Monitored during session;Limited activity within patient's tolerance;Premedicated before session;Repositioned    Home Living                      Prior Function  PT Goals (current goals can now be found in the care plan section) Acute Rehab PT Goals Patient Stated Goal: go home PT Goal Formulation: With patient Potential to Achieve Goals: Good    Frequency  7X/week    PT Plan Current plan remains appropriate    Co-evaluation              End of Session Equipment Utilized During Treatment: Gait belt;Left knee immobilizer Activity Tolerance: Patient tolerated treatment well Patient left: with call bell/phone within reach;in bed     Time: 1142-1204 PT Time Calculation (min) (ACUTE ONLY): 22 min  Charges:  $Gait Training: 8-22 mins $Therapeutic Exercise: 8-22 mins                    G Codes:      Salina April, PTA Pager: [phone]   07/29/2015, 1:00 PM

## 2016-07-21 NOTE — H&P (Signed)
Lori [name] is an 56 y.o. female.    Chief Complaint: right knee pain  HPI: Pt is a 56 y.o. female complaining of right knee pain for multiple years. Pain had continually increased since the beginning. X-rays in the clinic show end-stage arthritic changes of the right knee. Pt has tried various conservative treatments which have failed to alleviate their symptoms, including injections and therapy. Various options are discussed with the patient. Risks, benefits and expectations were discussed with the patient. Patient understand the risks, benefits and expectations and wishes to proceed with surgery.   PCP:  Myriam Jacobson, MD  D/C Plans: Home  PMH: Past Medical History:  Diagnosis Date  . Arthritis   . Diabetes mellitus without complication (HCC)    diet controlled  . GERD (gastroesophageal reflux disease)   . Headache   . Hypothyroidism     PSH: Past Surgical History:  Procedure Laterality Date  . ANKLE ARTHROSCOPY     knee  . broken arm     left  . CATARACT EXTRACTION    . CHOLECYSTECTOMY    . DILATION AND CURETTAGE OF UTERUS    . TOTAL KNEE ARTHROPLASTY Left 07/26/2015  . TOTAL KNEE ARTHROPLASTY Left 07/26/2015   Procedure: LEFT TOTAL KNEE ARTHROPLASTY;  Surgeon: Netta Cedars, MD;  Location: [place];  Service: Orthopedics;  Laterality: Left;    Social History:  reports that she has never smoked. She has never used smokeless tobacco. She reports that she does not drink alcohol or use drugs.  Allergies:  Allergies  Allergen Reactions  . Celebrex [Celecoxib] Other (See Comments)    Gives her a 'really bad headache'  . Other Nausea And Vomiting    Strong pain medications must be taken with nausea medicine  PRE-DIABETIC CONTROLLED THROUGH DIET    Medications: No current facility-administered medications for this encounter.    Current Outpatient Prescriptions  Medication Sig Dispense Refill  . aspirin-acetaminophen-caffeine (EXCEDRIN MIGRAINE) 250-250-65 MG  tablet Take 2 tablets by mouth every 6 (six) hours as needed for migraine.    . bimatoprost (LUMIGAN) 0.01 % SOLN Place 1 drop into both eyes at bedtime.    . Ciclesonide (ZETONNA) 37 MCG/ACT AERS Place 1-2 sprays into the nose daily as needed (for nasal congestion).    . clonazePAM (KLONOPIN) 1 MG tablet Take 0.5-1 mg by mouth at bedtime as needed. For restless leg syndrome    . Coenzyme Q10-Vitamin E (QUNOL ULTRA COQ10 PO) Take 1 capsule by mouth daily.    . DHA-EPA-Vitamin E (OMEGA-3 COMPLEX PO) Take 1 capsule by mouth 2 (two) times daily.    Marland Kitchen ezetimibe (ZETIA) 10 MG tablet Take 10 mg by mouth daily.    Marland Kitchen levothyroxine (SYNTHROID, LEVOTHROID) 50 MCG tablet Take 50 mcg by mouth daily before breakfast.    . Misc Natural Products (OSTEO BI-FLEX ADV DOUBLE ST) TABS Take 2 tablets by mouth every morning.    . montelukast (SINGULAIR) 10 MG tablet Take 10 mg by mouth at bedtime.    . Multiple Vitamins-Minerals (MULTIVITAMIN WITH MINERALS) tablet Take 1 tablet by mouth daily.    . Multiple Vitamins-Minerals (PRESERVISION AREDS 2 PO) Take 1 capsule by mouth 2 (two) times daily.    . naproxen sodium (ALEVE) 220 MG tablet Take 440 mg by mouth 2 (two) times daily.    . polyvinyl alcohol (LIQUIFILM TEARS) 1.4 % ophthalmic solution Place 1 drop into both eyes daily before breakfast.    . Selenium 200 MCG TABS Take  200 mcg by mouth at bedtime.      No results found for this or any previous visit (from the past 48 hour(s)). No results found.  ROS: Pain with rom of the right lower extremity  Physical Exam:  Alert and oriented 56 y.o. female in no acute distress Cranial nerves 2-12 intact Cervical spine: full rom with no tenderness, nv intact distally Chest: active breath sounds bilaterally, no wheeze rhonchi or rales Heart: regular rate and rhythm, no murmur Abd: non tender non distended with active bowel sounds Hip is stable with rom  Right knee with moderate crepitus with rom nv intact  distally No rashes or edema Antalgic gait  Assessment/Plan Assessment: right knee end stage osteoarthritis  Plan: Patient will undergo a right total knee by Dr. Veverly Fells at [facility]. Risks benefits and expectations were discussed with the patient. Patient understand risks, benefits and expectations and wishes to proceed.

## 2016-07-22 ENCOUNTER — Encounter (HOSPITAL_COMMUNITY): Payer: Self-pay

## 2016-07-22 ENCOUNTER — Encounter (HOSPITAL_COMMUNITY)
Admission: RE | Admit: 2016-07-22 | Discharge: 2016-07-22 | Disposition: A | Discharge status: Home / Self Care | Payer: 59 | Source: Ambulatory Visit | Attending: Orthopedic Surgery | Admitting: Orthopedic Surgery

## 2016-07-22 DIAGNOSIS — E119 Type 2 diabetes mellitus without complications: Secondary | ICD-10-CM | POA: Diagnosis not present

## 2016-07-22 DIAGNOSIS — M1711 Unilateral primary osteoarthritis, right knee: Secondary | ICD-10-CM | POA: Insufficient documentation

## 2016-07-22 DIAGNOSIS — Z01818 Encounter for other preprocedural examination: Secondary | ICD-10-CM | POA: Diagnosis present

## 2016-07-22 HISTORY — DX: Restless legs syndrome: G25.81

## 2016-07-22 HISTORY — DX: Chondrocostal junction syndrome (tietze): M94.0

## 2016-07-22 HISTORY — DX: Other seasonal allergic rhinitis: J30.2

## 2016-07-22 HISTORY — DX: Abnormal levels of other serum enzymes: R74.8

## 2016-07-22 LAB — SURGICAL PCR SCREEN
MRSA, PCR: NEGATIVE
Staphylococcus aureus: NEGATIVE

## 2016-07-22 LAB — CBC
HEMATOCRIT: 45.1 % (ref 36.0–46.0)
Hemoglobin: 15.5 g/dL — ABNORMAL HIGH (ref 12.0–15.0)
MCH: 31.1 pg (ref 26.0–34.0)
MCHC: 34.4 g/dL (ref 30.0–36.0)
MCV: 90.6 fL (ref 78.0–100.0)
Platelets: 203 10*3/uL (ref 150–400)
RBC: 4.98 MIL/uL (ref 3.87–5.11)
RDW: 13.7 % (ref 11.5–15.5)
WBC: 8.1 10*3/uL (ref 4.0–10.5)

## 2016-07-22 LAB — COMPREHENSIVE METABOLIC PANEL
ALBUMIN: 4.2 g/dL (ref 3.5–5.0)
ALT: 83 U/L — ABNORMAL HIGH (ref 14–54)
AST: 56 U/L — ABNORMAL HIGH (ref 15–41)
Alkaline Phosphatase: 69 U/L (ref 38–126)
Anion gap: 9 (ref 5–15)
BUN: 16 mg/dL (ref 6–20)
CHLORIDE: 105 mmol/L (ref 101–111)
CO2: 25 mmol/L (ref 22–32)
Calcium: 9.9 mg/dL (ref 8.9–10.3)
Creatinine, Ser: 0.8 mg/dL (ref 0.44–1.00)
GFR calc Af Amer: >60 mL/min (ref >60)
GFR calc non Af Amer: >60 mL/min (ref >60)
GLUCOSE: 91 mg/dL (ref 65–99)
Potassium: 4.3 mmol/L (ref 3.5–5.1)
Sodium: 139 mmol/L (ref 135–145)
Total Bilirubin: 0.5 mg/dL (ref 0.3–1.2)
Total Protein: 6.8 g/dL (ref 6.5–8.1)

## 2016-07-22 LAB — GLUCOSE, CAPILLARY: GLUCOSE-CAPILLARY: 87 mg/dL (ref 65–99)

## 2016-07-22 NOTE — Progress Notes (Signed)
Pt denies SOB, chest pain, and being under the care of a cardiologist. Pt denies having an echo and cardiac cath but stated that a stress test was performed greater than 10 years ago. Pt denies having a chest x ray within the last year but stated that an EKG was recently done; records requested from PCP, Dr. Lorene Dy. Pt chart forwarded to anesthesia to review abnormal labs ( elevated liver enzymes).

## 2016-07-22 NOTE — Pre-Procedure Instructions (Signed)
Lori [name]  07/22/2016      [place], [place] [facility] [place] [address] [place] [address] Phone: [phone] Fax: [phone]  [id number] - [place], [place] [address] [place] [address] Phone: [phone] Fax: [phone]    Your procedure is scheduled on Friday, July 31, 2016  Report to [facility] Admitting at 5:45 A.M.  Call this number if you have problems the morning of surgery:  [phone]   Remember:  Do not eat food or drink liquids after midnight Thursday, July 30, 2016  Take these medicines the morning of surgery with A SIP OF WATER : levothyroxine (SYNTHROID),ezetimibe (ZETIA),  If needed: LIQUIFILM TEARS)  eye drops, Ciclesonide (ZETONNA) nasal spray for congestion Stop taking Aspirin, vitamins, fish oil and herbal medications (Coenzyme Q10-Vitamin E,  OSTEO BI-FLEX, Selenium,  . Do not take any NSAIDs ie: Ibuprofen, Advil, Naproxen ( Aleve), BC and Goody Powder or any medication containing Aspirin such as EXCEDRIN MIGRAINE; stop now.    How to Manage Your Diabetes Before and After Surgery  Why is it important to control my blood sugar before and after surgery? . Improving blood sugar levels before and after surgery helps healing and can limit problems. . A way of improving blood sugar control is eating a healthy diet by: o  Eating less sugar and carbohydrates o  Increasing activity/exercise o  Talking with your doctor about reaching your blood sugar goals . High blood sugars (greater than 180 mg/dL) can raise your risk of infections and slow your recovery, so you will need to focus on controlling your diabetes during the weeks before surgery. . Make sure that the doctor who takes care of your diabetes knows about your planned surgery including the date and location.  How do I manage my blood sugar before surgery? . Check  your blood sugar at least 4 times a day, starting 2 days before surgery, to make sure that the level is not too high or low. o Check your blood sugar the morning of your surgery when you wake up and every 2 hours until you get to the Short Stay unit. . If your blood sugar is less than 70 mg/dL, you will need to treat for low blood sugar: o Do not take insulin. o Treat a low blood sugar (less than 70 mg/dL) with  cup of clear juice (cranberry or apple), 4 glucose tablets, OR glucose gel. o Recheck blood sugar in 15 minutes after treatment (to make sure it is greater than 70 mg/dL). If your blood sugar is not greater than 70 mg/dL on recheck, call [phone] for further instructions. . Report your blood sugar to the short stay nurse when you get to Short Stay.  . If you are admitted to the hospital after surgery: o Your blood sugar will be checked by the staff and you will probably be given insulin after surgery (instead of oral diabetes medicines) to make sure you have good blood sugar levels. o The goal for blood sugar control after surgery is 80-180 mg/dL.  WHAT DO I DO ABOUT MY DIABETES MEDICATION?  Pt not taking any diabetes medications    Patient Signature:          Date:   Nurse Signature:  Date:   Reviewed and Endorsed by [facility] Patient Education Committee, August 2015  Do  not wear jewelry, make-up or nail polish.  Do not wear lotions, powders, or perfumes, or deoderant.  Do not shave 48 hours prior to surgery.   Do not bring valuables to the hospital.  [facility] is not responsible for any belongings or valuables.  Contacts, dentures or bridgework may not be worn into surgery.  Leave your suitcase in the car.  After surgery it may be brought to your room.  For patients admitted to the hospital, discharge time will be determined by your treatment team.  Special instructions: Shower the night before surgery and the morning of surgery with CHG.  Please read over the  following fact sheets that you were given. Pain Booklet, Coughing and Deep Breathing, MRSA Information and Surgical Site Infection Prevention

## 2016-07-23 LAB — HEMOGLOBIN A1C
Hgb A1c MFr Bld: 5.5 % (ref 4.8–5.6)
Mean Plasma Glucose: 111 mg/dL

## 2016-07-30 MED ORDER — CEFAZOLIN SODIUM-DEXTROSE 2-4 GM/100ML-% IV SOLN
2.0000 g | INTRAVENOUS | Status: AC
  Administered 2016-07-31: 2 g via INTRAVENOUS
  Filled 2016-07-30: qty 100

## 2016-07-30 MED ORDER — SODIUM CHLORIDE 0.9 % IV SOLN
1000.0000 mg | INTRAVENOUS | Status: AC
  Administered 2016-07-31: 1000 mg via INTRAVENOUS
  Filled 2016-07-30 (×2): qty 10

## 2016-07-31 ENCOUNTER — Inpatient Hospital Stay (HOSPITAL_COMMUNITY)
Admission: RE | Admit: 2016-07-31 | Discharge: 2016-08-02 | DRG: 470 | Disposition: A | Discharge status: Home / Self Care | Payer: 59 | Source: Ambulatory Visit | Attending: Orthopedic Surgery | Admitting: Orthopedic Surgery

## 2016-07-31 ENCOUNTER — Inpatient Hospital Stay (HOSPITAL_COMMUNITY): Payer: 59 | Admitting: Certified Registered Nurse Anesthetist

## 2016-07-31 ENCOUNTER — Inpatient Hospital Stay (HOSPITAL_COMMUNITY): Payer: 59

## 2016-07-31 ENCOUNTER — Inpatient Hospital Stay (HOSPITAL_COMMUNITY): Payer: 59 | Admitting: Emergency Medicine

## 2016-07-31 ENCOUNTER — Encounter (HOSPITAL_COMMUNITY)
Admission: RE | Disposition: A | Discharge status: Home / Self Care | Payer: Self-pay | Source: Ambulatory Visit | Attending: Orthopedic Surgery

## 2016-07-31 ENCOUNTER — Encounter (HOSPITAL_COMMUNITY): Payer: Self-pay | Admitting: *Deleted

## 2016-07-31 DIAGNOSIS — M25561 Pain in right knee: Secondary | ICD-10-CM | POA: Diagnosis present

## 2016-07-31 DIAGNOSIS — Z96652 Presence of left artificial knee joint: Secondary | ICD-10-CM

## 2016-07-31 DIAGNOSIS — R262 Difficulty in walking, not elsewhere classified: Secondary | ICD-10-CM

## 2016-07-31 DIAGNOSIS — E119 Type 2 diabetes mellitus without complications: Secondary | ICD-10-CM | POA: Diagnosis present

## 2016-07-31 DIAGNOSIS — Z888 Allergy status to other drugs, medicaments and biological substances status: Secondary | ICD-10-CM | POA: Diagnosis not present

## 2016-07-31 DIAGNOSIS — Z79899 Other long term (current) drug therapy: Secondary | ICD-10-CM

## 2016-07-31 DIAGNOSIS — Z791 Long term (current) use of non-steroidal anti-inflammatories (NSAID): Secondary | ICD-10-CM

## 2016-07-31 DIAGNOSIS — K219 Gastro-esophageal reflux disease without esophagitis: Secondary | ICD-10-CM | POA: Diagnosis present

## 2016-07-31 DIAGNOSIS — Z9049 Acquired absence of other specified parts of digestive tract: Secondary | ICD-10-CM

## 2016-07-31 DIAGNOSIS — M1711 Unilateral primary osteoarthritis, right knee: Principal | ICD-10-CM | POA: Diagnosis present

## 2016-07-31 DIAGNOSIS — Z9849 Cataract extraction status, unspecified eye: Secondary | ICD-10-CM

## 2016-07-31 DIAGNOSIS — M25661 Stiffness of right knee, not elsewhere classified: Secondary | ICD-10-CM

## 2016-07-31 DIAGNOSIS — E039 Hypothyroidism, unspecified: Secondary | ICD-10-CM | POA: Diagnosis present

## 2016-07-31 DIAGNOSIS — Z96651 Presence of right artificial knee joint: Secondary | ICD-10-CM

## 2016-07-31 HISTORY — DX: Prediabetes: R73.03

## 2016-07-31 HISTORY — PX: TOTAL KNEE ARTHROPLASTY: SHX125

## 2016-07-31 LAB — GLUCOSE, CAPILLARY
GLUCOSE-CAPILLARY: 93 mg/dL (ref 65–99)
GLUCOSE-CAPILLARY: 103 mg/dL — AB (ref 65–99)

## 2016-07-31 SURGERY — ARTHROPLASTY, KNEE, TOTAL
Anesthesia: Monitor Anesthesia Care | Site: Knee | Laterality: Right

## 2016-07-31 MED ORDER — OSTEO BI-FLEX ADV DOUBLE ST PO TABS: 2.0000 | ORAL_TABLET | ORAL | Status: DC

## 2016-07-31 MED ORDER — MONTELUKAST SODIUM 10 MG PO TABS
10.0000 mg | ORAL_TABLET | Freq: Every day | ORAL | Status: DC
  Administered 2016-07-31 – 2016-08-01 (×2): 10 mg via ORAL
  Filled 2016-07-31 (×2): qty 1

## 2016-07-31 MED ORDER — TRANEXAMIC ACID 1000 MG/10ML IV SOLN
1000.0000 mg | Freq: Once | INTRAVENOUS | Status: DC
  Filled 2016-07-31: qty 10

## 2016-07-31 MED ORDER — BUPIVACAINE IN DEXTROSE 0.75-8.25 % IT SOLN
INTRATHECAL | Status: DC | PRN
  Administered 2016-07-31: 1.6 mL via INTRATHECAL

## 2016-07-31 MED ORDER — METOCLOPRAMIDE HCL 5 MG PO TABS: 5.0000 mg | ORAL_TABLET | Freq: Three times a day (TID) | ORAL | Status: DC | PRN

## 2016-07-31 MED ORDER — ONDANSETRON HCL 4 MG PO TABS: 4.0000 mg | ORAL_TABLET | Freq: Four times a day (QID) | ORAL | Status: DC | PRN

## 2016-07-31 MED ORDER — PROMETHAZINE HCL 25 MG/ML IJ SOLN: 12.5000 mg | INTRAMUSCULAR | Status: DC | PRN

## 2016-07-31 MED ORDER — ONDANSETRON HCL 4 MG/2ML IJ SOLN: 4.0000 mg | Freq: Four times a day (QID) | INTRAMUSCULAR | Status: DC | PRN

## 2016-07-31 MED ORDER — METHOCARBAMOL 500 MG PO TABS: 500.0000 mg | ORAL_TABLET | Freq: Three times a day (TID) | ORAL | 1 refills | Status: DC | PRN

## 2016-07-31 MED ORDER — SODIUM CHLORIDE 0.9 % IV SOLN
INTRAVENOUS | Status: DC
  Administered 2016-07-31: 11:00:00 via INTRAVENOUS

## 2016-07-31 MED ORDER — DOCUSATE SODIUM 100 MG PO CAPS
100.0000 mg | ORAL_CAPSULE | Freq: Two times a day (BID) | ORAL | Status: DC
  Administered 2016-07-31 – 2016-08-01 (×3): 100 mg via ORAL
  Filled 2016-07-31 (×5): qty 1

## 2016-07-31 MED ORDER — POLYETHYLENE GLYCOL 3350 17 G PO PACK: 17.0000 g | PACK | Freq: Every day | ORAL | Status: DC | PRN

## 2016-07-31 MED ORDER — METOCLOPRAMIDE HCL 5 MG/ML IJ SOLN
5.0000 mg | Freq: Three times a day (TID) | INTRAMUSCULAR | Status: DC | PRN
  Administered 2016-07-31: 10 mg via INTRAVENOUS
  Filled 2016-07-31 (×2): qty 2

## 2016-07-31 MED ORDER — SODIUM CHLORIDE 0.9 % IR SOLN
Status: DC | PRN
  Administered 2016-07-31: 3000 mL

## 2016-07-31 MED ORDER — CHLORHEXIDINE GLUCONATE 4 % EX LIQD: 60.0000 mL | Freq: Once | CUTANEOUS | Status: DC

## 2016-07-31 MED ORDER — ACETAMINOPHEN 650 MG RE SUPP: 650.0000 mg | Freq: Four times a day (QID) | RECTAL | Status: DC | PRN

## 2016-07-31 MED ORDER — FERROUS SULFATE 325 (65 FE) MG PO TABS
325.0000 mg | ORAL_TABLET | Freq: Three times a day (TID) | ORAL | Status: DC
  Administered 2016-07-31 – 2016-08-02 (×5): 325 mg via ORAL
  Filled 2016-07-31 (×5): qty 1

## 2016-07-31 MED ORDER — KETAMINE HCL-SODIUM CHLORIDE 100-0.9 MG/10ML-% IV SOSY
PREFILLED_SYRINGE | INTRAVENOUS | Status: AC
  Filled 2016-07-31: qty 10

## 2016-07-31 MED ORDER — FENTANYL CITRATE (PF) 100 MCG/2ML IJ SOLN
INTRAMUSCULAR | Status: AC
  Filled 2016-07-31: qty 2

## 2016-07-31 MED ORDER — DEXTROSE 5 % IV SOLN
INTRAVENOUS | Status: DC | PRN
  Administered 2016-07-31: 50 ug/min via INTRAVENOUS

## 2016-07-31 MED ORDER — EZETIMIBE 10 MG PO TABS
10.0000 mg | ORAL_TABLET | Freq: Every day | ORAL | Status: DC
  Administered 2016-08-01 – 2016-08-02 (×2): 10 mg via ORAL
  Filled 2016-07-31 (×3): qty 1

## 2016-07-31 MED ORDER — LATANOPROST 0.005 % OP SOLN
1.0000 [drp] | Freq: Every day | OPHTHALMIC | Status: DC
  Administered 2016-07-31 – 2016-08-01 (×2): 1 [drp] via OPHTHALMIC
  Filled 2016-07-31: qty 2.5

## 2016-07-31 MED ORDER — PHENOL 1.4 % MT LIQD: 1.0000 | OROMUCOSAL | Status: DC | PRN

## 2016-07-31 MED ORDER — METHOCARBAMOL 1000 MG/10ML IJ SOLN
500.0000 mg | Freq: Four times a day (QID) | INTRAMUSCULAR | Status: DC | PRN
Start: 2016-07-31 — End: 2016-08-02

## 2016-07-31 MED ORDER — SODIUM CHLORIDE 0.9 % IV SOLN
2000.0000 mg | INTRAVENOUS | Status: DC
  Filled 2016-07-31: qty 20

## 2016-07-31 MED ORDER — HYDROMORPHONE HCL 1 MG/ML IJ SOLN: 1.0000 mg | INTRAMUSCULAR | Status: DC | PRN

## 2016-07-31 MED ORDER — 0.9 % SODIUM CHLORIDE (POUR BTL) OPTIME
TOPICAL | Status: DC | PRN
  Administered 2016-07-31: 1000 mL

## 2016-07-31 MED ORDER — OXYCODONE HCL 5 MG PO TABS
5.0000 mg | ORAL_TABLET | ORAL | Status: DC | PRN
  Administered 2016-07-31 – 2016-08-02 (×11): 10 mg via ORAL
  Filled 2016-07-31 (×11): qty 2

## 2016-07-31 MED ORDER — MULTI-VITAMIN/MINERALS PO TABS: 1.0000 | ORAL_TABLET | Freq: Every day | ORAL | Status: DC

## 2016-07-31 MED ORDER — ASPIRIN EC 325 MG PO TBEC
325.0000 mg | DELAYED_RELEASE_TABLET | Freq: Two times a day (BID) | ORAL | Status: DC
  Administered 2016-07-31 – 2016-08-02 (×4): 325 mg via ORAL
  Filled 2016-07-31 (×5): qty 1

## 2016-07-31 MED ORDER — ASPIRIN EC 325 MG PO TBEC: 325.0000 mg | DELAYED_RELEASE_TABLET | Freq: Two times a day (BID) | ORAL | 0 refills | Status: DC

## 2016-07-31 MED ORDER — LACTATED RINGERS IV SOLN
INTRAVENOUS | Status: DC | PRN
  Administered 2016-07-31 (×2): via INTRAVENOUS

## 2016-07-31 MED ORDER — POLYVINYL ALCOHOL 1.4 % OP SOLN
1.0000 [drp] | Freq: Every day | OPHTHALMIC | Status: DC
  Administered 2016-08-01 – 2016-08-02 (×2): 1 [drp] via OPHTHALMIC
  Filled 2016-07-31: qty 15

## 2016-07-31 MED ORDER — PROPOFOL 10 MG/ML IV BOLUS
INTRAVENOUS | Status: DC | PRN
  Administered 2016-07-31 (×2): 20 mg via INTRAVENOUS

## 2016-07-31 MED ORDER — GLYCOPYRROLATE 0.2 MG/ML IJ SOLN
INTRAMUSCULAR | Status: DC | PRN
  Administered 2016-07-31 (×2): 0.1 mg via INTRAVENOUS

## 2016-07-31 MED ORDER — FENTANYL CITRATE (PF) 100 MCG/2ML IJ SOLN
INTRAMUSCULAR | Status: DC | PRN
  Administered 2016-07-31: 50 ug via INTRAVENOUS

## 2016-07-31 MED ORDER — OXYCODONE-ACETAMINOPHEN 5-325 MG PO TABS: 1.0000 | ORAL_TABLET | ORAL | 0 refills | Status: DC | PRN

## 2016-07-31 MED ORDER — SELENIUM 50 MCG PO TABS
200.0000 ug | ORAL_TABLET | Freq: Every day | ORAL | Status: DC
  Administered 2016-07-31 – 2016-08-01 (×2): 200 ug via ORAL
  Filled 2016-07-31 (×2): qty 4

## 2016-07-31 MED ORDER — METOCLOPRAMIDE HCL 5 MG/ML IJ SOLN: 10.0000 mg | Freq: Once | INTRAMUSCULAR | Status: DC | PRN

## 2016-07-31 MED ORDER — ONDANSETRON HCL 4 MG/2ML IJ SOLN
INTRAMUSCULAR | Status: DC | PRN
  Administered 2016-07-31: 4 mg via INTRAVENOUS

## 2016-07-31 MED ORDER — DEXAMETHASONE SODIUM PHOSPHATE 10 MG/ML IJ SOLN
INTRAMUSCULAR | Status: AC
  Filled 2016-07-31: qty 1

## 2016-07-31 MED ORDER — LACTATED RINGERS IV SOLN: INTRAVENOUS | Status: DC

## 2016-07-31 MED ORDER — ONDANSETRON HCL 4 MG/2ML IJ SOLN
INTRAMUSCULAR | Status: AC
  Filled 2016-07-31: qty 2

## 2016-07-31 MED ORDER — MIDAZOLAM HCL 2 MG/2ML IJ SOLN
INTRAMUSCULAR | Status: DC | PRN
  Administered 2016-07-31 (×2): 1 mg via INTRAVENOUS

## 2016-07-31 MED ORDER — PROPOFOL 500 MG/50ML IV EMUL
INTRAVENOUS | Status: DC | PRN
  Administered 2016-07-31: 25 ug/kg/min via INTRAVENOUS

## 2016-07-31 MED ORDER — HYDROMORPHONE HCL 2 MG/ML IJ SOLN: 1.0000 mg | INTRAMUSCULAR | Status: DC | PRN

## 2016-07-31 MED ORDER — CICLESONIDE 37 MCG/ACT NA AERS: 1.0000 | INHALATION_SPRAY | Freq: Every day | NASAL | Status: DC | PRN

## 2016-07-31 MED ORDER — LEVOTHYROXINE SODIUM 50 MCG PO TABS
50.0000 ug | ORAL_TABLET | Freq: Every day | ORAL | Status: DC
  Administered 2016-08-01 – 2016-08-02 (×2): 50 ug via ORAL
  Filled 2016-07-31 (×2): qty 1

## 2016-07-31 MED ORDER — FENTANYL CITRATE (PF) 100 MCG/2ML IJ SOLN
25.0000 ug | INTRAMUSCULAR | Status: AC | PRN
  Administered 2016-07-31 (×6): 25 ug via INTRAVENOUS

## 2016-07-31 MED ORDER — PROMETHAZINE HCL 25 MG PO TABS
12.5000 mg | ORAL_TABLET | ORAL | Status: DC | PRN
  Administered 2016-07-31: 25 mg via ORAL
  Filled 2016-07-31: qty 1

## 2016-07-31 MED ORDER — MEPERIDINE HCL 25 MG/ML IJ SOLN: 6.2500 mg | INTRAMUSCULAR | Status: DC | PRN

## 2016-07-31 MED ORDER — BUPIVACAINE-EPINEPHRINE (PF) 0.5% -1:200000 IJ SOLN
INTRAMUSCULAR | Status: DC | PRN
  Administered 2016-07-31: 25 mL via PERINEURAL

## 2016-07-31 MED ORDER — MIDAZOLAM HCL 2 MG/2ML IJ SOLN
INTRAMUSCULAR | Status: AC
  Filled 2016-07-31: qty 2

## 2016-07-31 MED ORDER — ACETAMINOPHEN 325 MG PO TABS: 650.0000 mg | ORAL_TABLET | Freq: Four times a day (QID) | ORAL | Status: DC | PRN

## 2016-07-31 MED ORDER — PROMETHAZINE HCL 25 MG RE SUPP: 12.5000 mg | RECTAL | Status: DC | PRN

## 2016-07-31 MED ORDER — KETAMINE HCL 10 MG/ML IJ SOLN
INTRAMUSCULAR | Status: DC | PRN
  Administered 2016-07-31 (×3): 10 mg via INTRAVENOUS

## 2016-07-31 MED ORDER — CLONAZEPAM 0.5 MG PO TABS
0.5000 mg | ORAL_TABLET | Freq: Two times a day (BID) | ORAL | Status: DC | PRN
  Administered 2016-08-01: 0.5 mg via ORAL
  Filled 2016-07-31: qty 1

## 2016-07-31 MED ORDER — MENTHOL 3 MG MT LOZG: 1.0000 | LOZENGE | OROMUCOSAL | Status: DC | PRN

## 2016-07-31 MED ORDER — HYDROMORPHONE HCL 2 MG/ML IJ SOLN
1.0000 mg | INTRAMUSCULAR | Status: DC | PRN
  Administered 2016-07-31 (×3): 1 mg via INTRAVENOUS
  Filled 2016-07-31 (×3): qty 1

## 2016-07-31 MED ORDER — SELENIUM 200 MCG PO TABS: 200.0000 ug | ORAL_TABLET | Freq: Every day | ORAL | Status: DC

## 2016-07-31 MED ORDER — CEFAZOLIN SODIUM-DEXTROSE 2-4 GM/100ML-% IV SOLN
2.0000 g | Freq: Four times a day (QID) | INTRAVENOUS | Status: AC
  Administered 2016-07-31 (×2): 2 g via INTRAVENOUS
  Filled 2016-07-31 (×2): qty 100

## 2016-07-31 MED ORDER — ADULT MULTIVITAMIN W/MINERALS CH
1.0000 | ORAL_TABLET | Freq: Every day | ORAL | Status: DC
  Administered 2016-08-01 – 2016-08-02 (×2): 1 via ORAL
  Filled 2016-07-31 (×3): qty 1

## 2016-07-31 MED ORDER — METHOCARBAMOL 500 MG PO TABS
500.0000 mg | ORAL_TABLET | Freq: Four times a day (QID) | ORAL | Status: DC | PRN
  Administered 2016-07-31 – 2016-08-02 (×6): 500 mg via ORAL
  Filled 2016-07-31 (×6): qty 1

## 2016-07-31 MED ORDER — DEXAMETHASONE SODIUM PHOSPHATE 10 MG/ML IJ SOLN
INTRAMUSCULAR | Status: DC | PRN
  Administered 2016-07-31: 10 mg via INTRAVENOUS

## 2016-07-31 MED ORDER — BISACODYL 10 MG RE SUPP: 10.0000 mg | Freq: Every day | RECTAL | Status: DC | PRN

## 2016-07-31 MED ORDER — LIDOCAINE 2% (20 MG/ML) 5 ML SYRINGE
INTRAMUSCULAR | Status: AC
  Filled 2016-07-31: qty 5

## 2016-07-31 SURGICAL SUPPLY — 61 items
BANDAGE ESMARK 6X9 LF (GAUZE/BANDAGES/DRESSINGS) ×1 IMPLANT
BLADE SAG 18X100X1.27 (BLADE) IMPLANT
BLADE SAW SGTL 13X75X1.27 (BLADE) ×3 IMPLANT
BLADE SAW SGTL 18X1.27X75 (BLADE) ×2 IMPLANT
BLADE SAW SGTL 18X1.27X75MM (BLADE) ×1
BNDG ELASTIC 6X10 VLCR STRL LF (GAUZE/BANDAGES/DRESSINGS) ×3 IMPLANT
BNDG ESMARK 6X9 LF (GAUZE/BANDAGES/DRESSINGS) ×3
BNDG GAUZE ELAST 4 BULKY (GAUZE/BANDAGES/DRESSINGS) ×3 IMPLANT
BOWL SMART MIX CTS (DISPOSABLE) ×3 IMPLANT
CAP KNEE TOTAL 3 SIGMA ×3 IMPLANT
CEMENT HV SMART SET (Cement) ×6 IMPLANT
CLOSURE WOUND 1/2 X4 (GAUZE/BANDAGES/DRESSINGS) ×2
COVER SURGICAL LIGHT HANDLE (MISCELLANEOUS) ×3 IMPLANT
CUFF TOURNIQUET SINGLE 34IN LL (TOURNIQUET CUFF) ×3 IMPLANT
CUFF TOURNIQUET SINGLE 44IN (TOURNIQUET CUFF) IMPLANT
DRAPE EXTREMITY T 121X128X90 (DRAPE) ×3 IMPLANT
DRAPE PROXIMA HALF (DRAPES) ×3 IMPLANT
DRAPE U-SHAPE 47X51 STRL (DRAPES) ×3 IMPLANT
DRSG ADAPTIC 3X8 NADH LF (GAUZE/BANDAGES/DRESSINGS) ×3 IMPLANT
DRSG PAD ABDOMINAL 8X10 ST (GAUZE/BANDAGES/DRESSINGS) ×3 IMPLANT
DURAPREP 26ML APPLICATOR (WOUND CARE) ×6 IMPLANT
ELECT CAUTERY BLADE 6.4 (BLADE) ×3 IMPLANT
ELECT REM PT RETURN 9FT ADLT (ELECTROSURGICAL) ×3
ELECTRODE REM PT RTRN 9FT ADLT (ELECTROSURGICAL) ×1 IMPLANT
GAUZE SPONGE 4X4 12PLY STRL (GAUZE/BANDAGES/DRESSINGS) ×3 IMPLANT
GLOVE BIOGEL PI ORTHO PRO 7.5 (GLOVE) ×2
GLOVE BIOGEL PI ORTHO PRO SZ7 (GLOVE) ×2
GLOVE BIOGEL PI ORTHO PRO SZ8 (GLOVE) ×2
GLOVE ORTHO TXT STRL SZ7.5 (GLOVE) ×3 IMPLANT
GLOVE PI ORTHO PRO STRL 7.5 (GLOVE) ×1 IMPLANT
GLOVE PI ORTHO PRO STRL SZ7 (GLOVE) ×1 IMPLANT
GLOVE PI ORTHO PRO STRL SZ8 (GLOVE) ×1 IMPLANT
GLOVE SURG ORTHO 8.5 STRL (GLOVE) ×3 IMPLANT
GOWN STRL REUS W/ TWL XL LVL3 (GOWN DISPOSABLE) ×3 IMPLANT
GOWN STRL REUS W/TWL XL LVL3 (GOWN DISPOSABLE) ×6
HANDPIECE INTERPULSE COAX TIP (DISPOSABLE) ×2
IMMOBILIZER KNEE 22 UNIV (SOFTGOODS) ×3 IMPLANT
KIT BASIN OR (CUSTOM PROCEDURE TRAY) ×3 IMPLANT
KIT MANIFOLD (MISCELLANEOUS) IMPLANT
KIT ROOM TURNOVER OR (KITS) ×3 IMPLANT
MANIFOLD NEPTUNE II (INSTRUMENTS) ×3 IMPLANT
NS IRRIG 1000ML POUR BTL (IV SOLUTION) ×3 IMPLANT
PACK TOTAL JOINT (CUSTOM PROCEDURE TRAY) ×3 IMPLANT
PACK UNIVERSAL I (CUSTOM PROCEDURE TRAY) IMPLANT
PAD ARMBOARD 7.5X6 YLW CONV (MISCELLANEOUS) ×6 IMPLANT
SET HNDPC FAN SPRY TIP SCT (DISPOSABLE) ×1 IMPLANT
STRIP CLOSURE SKIN 1/2X4 (GAUZE/BANDAGES/DRESSINGS) ×4 IMPLANT
SUCTION FRAZIER HANDLE 10FR (MISCELLANEOUS) ×2
SUCTION TUBE FRAZIER 10FR DISP (MISCELLANEOUS) ×1 IMPLANT
SUT MNCRL AB 3-0 PS2 18 (SUTURE) ×3 IMPLANT
SUT VIC AB 0 CT1 27 (SUTURE) ×2
SUT VIC AB 0 CT1 27XBRD ANBCTR (SUTURE) ×1 IMPLANT
SUT VIC AB 1 CT1 27 (SUTURE) ×6
SUT VIC AB 1 CT1 27XBRD ANBCTR (SUTURE) ×3 IMPLANT
SUT VIC AB 2-0 CT1 27 (SUTURE) ×4
SUT VIC AB 2-0 CT1 TAPERPNT 27 (SUTURE) ×2 IMPLANT
TOWEL OR 17X24 6PK STRL BLUE (TOWEL DISPOSABLE) ×3 IMPLANT
TOWEL OR 17X26 10 PK STRL BLUE (TOWEL DISPOSABLE) ×3 IMPLANT
TRAY CATH 16FR W/PLASTIC CATH (SET/KITS/TRAYS/PACK) IMPLANT
TRAY FOLEY CATH 16FRSI W/METER (SET/KITS/TRAYS/PACK) ×3 IMPLANT
WATER STERILE IRR 1000ML POUR (IV SOLUTION) IMPLANT

## 2016-07-31 NOTE — Discharge Instructions (Signed)
Ice the knee as much as you can.  Keep the knee incision covered and clean and dry for one week, then ok to get it wet in the shower.  Do not prop anything under the knee.  Prop under the ankle to maintain knee extension.  Do exercises every hour - calf pumps, heel slides, quad sets, knee dangling over bed.  Weight bearing as tolerated on the right LE  Take 325mg  Aspirin twice daily with food for one month to thin the blood and prevent blood clots.  Please wear TED hose and change positions frequently  Wear knee immobilizer at night to maintain extension  Follow up in two weeks in the office [phone]

## 2016-07-31 NOTE — Anesthesia Postprocedure Evaluation (Signed)
Anesthesia Post Note  Patient: Lori [name]  Procedure(s) Performed: Procedure(s) (LRB): TOTAL KNEE ARTHROPLASTY (Right)  Patient location during evaluation: PACU Anesthesia Type: Regional and Spinal Level of consciousness: awake and alert Pain management: pain level controlled Vital Signs Assessment: post-procedure vital signs reviewed and stable Respiratory status: spontaneous breathing, nonlabored ventilation, respiratory function stable and patient connected to nasal cannula oxygen Cardiovascular status: blood pressure returned to baseline and stable Postop Assessment: no signs of nausea or vomiting and spinal receding Anesthetic complications: no       Last Vitals:  Vitals:   07/31/16 1055 07/31/16 1113  BP:  111/66  Pulse:  (!) 59  Resp:    Temp: 36.5 C 36.6 C    Last Pain:  Vitals:   07/31/16 1113  TempSrc: Oral  PainSc:                  Montez Hageman

## 2016-07-31 NOTE — Transfer of Care (Signed)
Immediate Anesthesia Transfer of Care Note  Patient: Lori [name]  Procedure(s) Performed: Procedure(s): TOTAL KNEE ARTHROPLASTY (Right)  Patient Location: PACU  Anesthesia Type:MAC, Regional and Spinal  Level of Consciousness: awake, alert  and patient cooperative  Airway & Oxygen Therapy: Patient Spontanous Breathing and Patient connected to nasal cannula oxygen  Post-op Assessment: Report given to RN, Post -op Vital signs reviewed and stable, Patient moving all extremities X 4 and Patient able to stick tongue midline  Post vital signs: Reviewed and stable  Last Vitals:  Vitals:   07/31/16 0554  BP: 122/78  Pulse: 73  Resp: 18  Temp: 37.1 C    Last Pain:  Vitals:   07/31/16 0554  TempSrc: Oral         Complications: No apparent anesthesia complications

## 2016-07-31 NOTE — Evaluation (Signed)
Physical Therapy Evaluation Patient Details Name: Lori [name] MRN: [id number] DOB: March 15, 1961 Today's Date: 07/31/2016   History of Present Illness  56 yo female admitted on 07/31/16 for R TKA. PMH significant for OA, GERD, DM, Hypothyroid, L TKA 07/26/15.   Clinical Impression  Pt is POD 0 and moving well with therapy despite initial post op nausea. 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 Pt is able to move EOB and perform short distance gait with Min A this session and min cues for proper sequencing. Pt has L TKA performed in 2017 and feels this one is already more uncomfortable. Pt will benefit from continuing to be seen acutely in order to address the below deficits in order to assist with a smooth transition home.     Follow Up Recommendations Home health PT    Equipment Recommendations  None recommended by PT    Recommendations for Other Services       Precautions / Restrictions Precautions Precautions: Knee Precaution Booklet Issued: No Precaution Comments: Will review next session Required Braces or Orthoses: Knee Immobilizer - Right Knee Immobilizer - Right: Other (comment) (possibly when in bed only?) Restrictions Weight Bearing Restrictions: Yes RLE Weight Bearing: Weight bearing as tolerated      Mobility  Bed Mobility Overal bed mobility: Needs Assistance Bed Mobility: Supine to Sit;Sit to Supine     Supine to sit: Min assist;HOB elevated Sit to supine: Min assist   General bed mobility comments: Min A both into and out of bed to assist with RLE.   Transfers Overall transfer level: Needs assistance Equipment used: Rolling walker (2 wheeled) Transfers: Sit to/from Stand Sit to Stand: Min assist         General transfer comment: Min A for safety from EOB and to stabilize once standing  Ambulation/Gait Ambulation/Gait assistance: Min assist Ambulation Distance (Feet): 15 Feet (15 forward and  backwards) Assistive device: Rolling walker (2 wheeled) Gait Pattern/deviations: Step-through pattern;Decreased step length - left;Decreased stance time - right;Antalgic Gait velocity: decreased Gait velocity interpretation: Below normal speed for age/gender General Gait Details: Mild antalgic gait, min cues for sequencing  Stairs            Wheelchair Mobility    Modified Rankin (Stroke Patients Only)       Balance                                             Pertinent Vitals/Pain Pain Assessment: 0-10 Pain Score: 5  Pain Location: right knee Pain Descriptors / Indicators: Dull;Aching Pain Intervention(s): Monitored during session;Premedicated before session;Limited activity within patient's tolerance;Ice applied    Home Living Family/patient expects to be discharged to:: Private residence Living Arrangements: Spouse/significant other Available Help at Discharge: Family;Available 24 hours/day Type of Home: House Home Access: Stairs to enter Entrance Stairs-Rails: Left Entrance Stairs-Number of Steps: 3 Home Layout: Two level;Able to live on main level with bedroom/bathroom Home Equipment: Gilford Rile - 2 wheels;Bedside commode      Prior Function Level of Independence: Independent         Comments: 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      Hand Dominance   Dominant Hand: Right    Extremity/Trunk Assessment   Upper Extremity Assessment Upper Extremity Assessment: Defer to OT evaluation    Lower Extremity Assessment Lower Extremity  Assessment: RLE deficits/detail RLE Deficits / Details: pt with normal post op pain and weakness. At least 3/5 ankle and 2/5 knee and hip per gross functional assessment       Communication   Communication: No difficulties  Cognition Arousal/Alertness: Lethargic;Suspect due to medications Behavior During Therapy: [facility] for tasks assessed/performed Overall Cognitive Status: Within Functional Limits for  tasks assessed                      General Comments      Exercises     Assessment/Plan    PT Assessment Patient needs continued PT services  PT Problem List Decreased strength;Decreased range of motion;Decreased balance;Decreased mobility;Decreased knowledge of use of DME;Pain          PT Treatment Interventions DME instruction;Gait training;Stair training;Functional mobility training;Therapeutic activities;Therapeutic exercise;Balance training;Patient/family education    PT Goals (Current goals can be found in the Care Plan section)  Acute Rehab PT Goals Patient Stated Goal: to get home  PT Goal Formulation: With patient Time For Goal Achievement: 08/07/16 Potential to Achieve Goals: Good    Frequency 7X/week   Barriers to discharge        Co-evaluation               End of Session Equipment Utilized During Treatment: Gait belt Activity Tolerance: Patient limited by pain;Patient limited by lethargy Patient left: in chair;with call bell/phone within reach;with SCD's reapplied Nurse Communication: Mobility status;Patient requests pain meds         Time: [id number] PT Time Calculation (min) (ACUTE ONLY): 38 min   Charges:   PT Evaluation $PT Eval Moderate Complexity: 1 Procedure PT Treatments $Gait Training: 8-22 mins $Therapeutic Activity: 8-22 mins   PT G Codes:        Scheryl Marten PT, DPT  [phone]  07/31/2016, 4:49 PM

## 2016-07-31 NOTE — Anesthesia Procedure Notes (Signed)
Anesthesia Regional Block:  Adductor canal block  Pre-Anesthetic Checklist: ,, timeout performed, Correct Patient, Correct Site, Correct Laterality, Correct Procedure, Correct Position, site marked, Risks and benefits discussed,  Surgical consent,  Pre-op evaluation,  At surgeon's request and post-op pain management  Laterality: Right and Lower  Prep: Maximum Sterile Barrier Precautions used, chloraprep       Needles:  Injection technique: Single-shot  Needle Type: Echogenic Stimulator Needle     Needle Length: 10cm 10 cm Needle Gauge: 21 G    Additional Needles:  Procedures: ultrasound guided (picture in chart) Adductor canal block Narrative:  Start time: 07/31/2016 6:58 AM End time: 07/31/2016 7:08 AM Injection made incrementally with aspirations every 5 mL.  Performed by: Personally  Anesthesiologist: Montez Hageman  Additional Notes: Risks, benefits and alternative to block explained extensively.  Patient tolerated procedure well, without complications.

## 2016-07-31 NOTE — Anesthesia Preprocedure Evaluation (Signed)
Anesthesia Evaluation  Patient identified by MRN, date of birth, ID band Patient awake    Reviewed: Allergy & Precautions, H&P , NPO status , Patient's Chart, lab work & pertinent test results  Airway Mallampati: II  TM Distance: >3 FB Neck ROM: Full    Dental no notable dental hx. (+) Teeth Intact, Dental Advisory Given   Pulmonary neg pulmonary ROS,    Pulmonary exam normal breath sounds clear to auscultation       Cardiovascular negative cardio ROS   Rhythm:Regular Rate:Normal     Neuro/Psych negative neurological ROS  negative psych ROS   GI/Hepatic Neg liver ROS, GERD  Controlled,  Endo/Other  diabetes, Well ControlledHypothyroidism   Renal/GU negative Renal ROS  negative genitourinary   Musculoskeletal   Abdominal   Peds  Hematology negative hematology ROS (+)   Anesthesia Other Findings   Reproductive/Obstetrics negative OB ROS                             Anesthesia Physical  Anesthesia Plan  ASA: II  Anesthesia Plan: Spinal   Post-op Pain Management:  Regional for Post-op pain   Induction: Intravenous  Airway Management Planned: Simple Face Mask  Additional Equipment:   Intra-op Plan:   Post-operative Plan:   Informed Consent: I have reviewed the patients History and Physical, chart, labs and discussed the procedure including the risks, benefits and alternatives for the proposed anesthesia with the patient or authorized representative who has indicated his/her understanding and acceptance.   Dental advisory given  Plan Discussed with: CRNA  Anesthesia Plan Comments:         Anesthesia Quick Evaluation

## 2016-07-31 NOTE — Progress Notes (Signed)
Orthopedic Tech Progress Note Patient Details:  Lori [name] 08-23-1960 [id number]  CPM Right Knee CPM Right Knee: On Right Knee Flexion (Degrees): 90 Right Knee Extension (Degrees): 0 Additional Comments: trapeze bar patient helper   Hildred Priest 07/31/2016, 10:50 AM Viewed order from doctor's order list

## 2016-07-31 NOTE — Brief Op Note (Signed)
07/31/2016  10:00 AM  PATIENT:  Lori [name]  56 y.o. female  PRE-OPERATIVE DIAGNOSIS:  Right knee osteoarthritis, end stage  POST-OPERATIVE DIAGNOSIS:  Right knee osteoarthritis, end stage  PROCEDURE:  Procedure(s): TOTAL KNEE ARTHROPLASTY (Right) DePuy Sigma RP  SURGEON:  Surgeon(s) and Role:    * Netta Cedars, MD - Primary  PHYSICIAN ASSISTANT:   ASSISTANTS: Ventura Bruns, PA-C   ANESTHESIA:   regional and spinal  EBL:  Total I/O In: 1400 [I.V.:1400] Out: 310 [Urine:300; Blood:10]  BLOOD ADMINISTERED:none  DRAINS: none   LOCAL MEDICATIONS USED:  NONE    SPECIMEN:  No Specimen  DISPOSITION OF SPECIMEN:  N/A  COUNTS:  YES  TOURNIQUET:   Total Tourniquet Time Documented: Thigh (Right) - 100 minutes Total: Thigh (Right) - 100 minutes   DICTATION: .Other Dictation: Dictation Number [id number]  PLAN OF CARE: Admit to inpatient   PATIENT DISPOSITION:  PACU - hemodynamically stable.   Delay start of Pharmacological VTE agent (>24hrs) due to surgical blood loss or risk of bleeding: no

## 2016-07-31 NOTE — Anesthesia Procedure Notes (Signed)
Spinal  Patient location during procedure: OR Staffing Anesthesiologist: Fawna Cranmer Performed: anesthesiologist  Preanesthetic Checklist Completed: patient identified, site marked, surgical consent, pre-op evaluation, timeout performed, IV checked, risks and benefits discussed and monitors and equipment checked Spinal Block Patient position: sitting Prep: Betadine Patient monitoring: heart rate, continuous pulse ox and blood pressure Approach: midline Location: L4-5 Injection technique: single-shot Needle Needle type: Sprotte  Needle gauge: 24 G Needle length: 9 cm Additional Notes Expiration date of kit checked and confirmed. Patient tolerated procedure well, without complications.       

## 2016-07-31 NOTE — Anesthesia Preprocedure Evaluation (Signed)
Anesthesia Evaluation    Airway        Dental   Pulmonary           Cardiovascular      Neuro/Psych    GI/Hepatic Elevated LFT's. No tylenol   Endo/Other    Renal/GU      Musculoskeletal   Abdominal   Peds  Hematology   Anesthesia Other Findings   Reproductive/Obstetrics                             Anesthesia Physical Anesthesia Plan Anesthesia Quick Evaluation

## 2016-07-31 NOTE — Interval H&P Note (Signed)
History and Physical Interval Note:  07/31/2016 7:25 AM  Lori [name]  has presented today for surgery, with the diagnosis of Right knee osteoarthritis  The various methods of treatment have been discussed with the patient and family. After consideration of risks, benefits and other options for treatment, the patient has consented to  Procedure(s): TOTAL KNEE ARTHROPLASTY (Right) as a surgical intervention .  The patient's history has been reviewed, patient examined, no change in status, stable for surgery.  I have reviewed the patient's chart and labs.  Questions were answered to the patient's satisfaction.     Brookie Wayment,STEVEN R

## 2016-08-01 LAB — BASIC METABOLIC PANEL
ANION GAP: 4 — AB (ref 5–15)
BUN: 9 mg/dL (ref 6–20)
CHLORIDE: 103 mmol/L (ref 101–111)
CO2: 29 mmol/L (ref 22–32)
Calcium: 9 mg/dL (ref 8.9–10.3)
Creatinine, Ser: 0.86 mg/dL (ref 0.44–1.00)
GFR calc Af Amer: >60 mL/min (ref >60)
GLUCOSE: 129 mg/dL — AB (ref 65–99)
POTASSIUM: 3.8 mmol/L (ref 3.5–5.1)
Sodium: 136 mmol/L (ref 135–145)

## 2016-08-01 LAB — CBC
HEMATOCRIT: 37.5 % (ref 36.0–46.0)
Hemoglobin: 12.7 g/dL (ref 12.0–15.0)
MCH: 30.6 pg (ref 26.0–34.0)
MCHC: 33.9 g/dL (ref 30.0–36.0)
MCV: 90.4 fL (ref 78.0–100.0)
PLATELETS: 186 10*3/uL (ref 150–400)
RBC: 4.15 MIL/uL (ref 3.87–5.11)
RDW: 13.3 % (ref 11.5–15.5)
WBC: 9.6 10*3/uL (ref 4.0–10.5)

## 2016-08-01 NOTE — Progress Notes (Signed)
Subjective: 1 Day Post-Op Procedure(s) (LRB): TOTAL KNEE ARTHROPLASTY (Right) Patient reports pain as 4 on 0-10 scale.    Objective: Vital signs in last 24 hours: Temp:  [97.7 F (36.5 C)-98.7 F (37.1 C)] 98.7 F (37.1 C) (01/13 0519) Pulse Rate:  [58-80] 80 (01/13 0519) Resp:  [15-19] 18 (01/13 0519) BP: (110-127)/(66-79) 117/69 (01/13 0519) SpO2:  [95 %-100 %] 98 % (01/13 0519)  Intake/Output from previous day: 01/12 0701 - 01/13 0700 In: 1950 [P.O.:400; I.V.:1550] Out: 2260 [Urine:2250; Blood:10] Intake/Output this shift: Total I/O In: 240 [P.O.:240] Out: -    Recent Labs  08/01/16 0614  HGB 12.7    Recent Labs  08/01/16 0614  WBC 9.6  RBC 4.15  HCT 37.5  PLT 186    Recent Labs  08/01/16 0614  NA 136  K 3.8  CL 103  CO2 29  BUN 9  CREATININE 0.86  GLUCOSE 129*  CALCIUM 9.0   No results for input(s): LABPT, INR in the last 72 hours.  Intact pulses distally Incision: dressing C/D/I  Assessment/Plan: 1 Day Post-Op Procedure(s) (LRB): TOTAL KNEE ARTHROPLASTY (Right) Up with therapy  Lori [name] ANDREW 08/01/2016, 10:24 AM

## 2016-08-01 NOTE — Progress Notes (Signed)
Subjective: 1 Day Post-Op Procedure(s) (LRB): TOTAL KNEE ARTHROPLASTY (Right) Patient reports pain as 3/10.  Reports a good night. Tolerating Po's. progressing with CPM and PT. Denies CP, SOb, or calf pain.  Objective: Vital signs in last 24 hours: Temp:  [97.9 F (36.6 C)-98.7 F (37.1 C)] 98.7 F (37.1 C) (01/13 0519) Pulse Rate:  [59-80] 80 (01/13 0519) Resp:  [18-19] 18 (01/13 0519) BP: (111-127)/(66-71) 117/69 (01/13 0519) SpO2:  [96 %-100 %] 98 % (01/13 0519)  Intake/Output from previous day: 01/12 0701 - 01/13 0700 In: 1950 [P.O.:400; I.V.:1550] Out: 2260 [Urine:2250; Blood:10] Intake/Output this shift: Total I/O In: 240 [P.O.:240] Out: -    Recent Labs  08/01/16 0614  HGB 12.7    Recent Labs  08/01/16 0614  WBC 9.6  RBC 4.15  HCT 37.5  PLT 186    Recent Labs  08/01/16 0614  NA 136  K 3.8  CL 103  CO2 29  BUN 9  CREATININE 0.86  GLUCOSE 129*  CALCIUM 9.0   No results for input(s): LABPT, INR in the last 72 hours.  Well nourished. Alert and oriented x3. RRR, Lungs clear, BS x4. Abdomen soft and non tender. Right Calf soft and non tender. Right knee dressing C/D/I. No DVT signs. Compartment soft. No signs of infection.  Right LE grossly neurovascular intact.  Assessment/Plan: 1 Day Post-Op Procedure(s) (LRB): TOTAL KNEE ARTHROPLASTY (Right) Up with PT CPM Plan to d/c tomorrow Seen by Dr.Collins Continue current care On ASA Talayia Hjort L 08/01/2016, 10:59 AM

## 2016-08-01 NOTE — Progress Notes (Signed)
Physical Therapy Treatment Patient Details Name: Lori [name] MRN: [id number] DOB: 08-16-1960 Today's Date: 08/01/2016    History of Present Illness 56 yo female admitted on 07/31/16 for R TKA. PMH significant for OA, GERD, DM, Hypothyroid, L TKA 07/26/15.     PT Comments    Pt is POD 1 and continues to be moving well with therapy. Initiated supine exercises to increased strength and ROM. Pt is able to perform increased gait distance this session including into the bathroom and out into the hallway. Pt is limited in gait distance by nausea. Pt left in CPM in bed this session but advised to inform NT when she is ready to get OOB.    Follow Up Recommendations  Home health PT     Equipment Recommendations  None recommended by PT    Recommendations for Other Services       Precautions / Restrictions Precautions Precautions: Knee Precaution Booklet Issued: Yes (comment) Precaution Comments: handout given and reviewed no pillow under knee Required Braces or Orthoses: Knee Immobilizer - Right Knee Immobilizer - Right: Other (comment) (possibly when in bed only?) Restrictions Weight Bearing Restrictions: Yes RLE Weight Bearing: Weight bearing as tolerated    Mobility  Bed Mobility Overal bed mobility: Needs Assistance Bed Mobility: Supine to Sit;Sit to Supine     Supine to sit: Min assist;HOB elevated Sit to supine: Min assist   General bed mobility comments: Min A both into and out of bed to assist with RLE.   Transfers Overall transfer level: Needs assistance Equipment used: Rolling walker (2 wheeled) Transfers: Sit to/from Stand Sit to Stand: Min guard         General transfer comment: Min guard for safety from EOB to stabilize once standing. Supervision on and off commode  Ambulation/Gait Ambulation/Gait assistance: Min guard Ambulation Distance (Feet): 50 Feet Assistive device: Rolling walker (2 wheeled) Gait Pattern/deviations: Step-through pattern;Decreased  step length - left;Decreased stance time - right;Antalgic Gait velocity: decreased Gait velocity interpretation: Below normal speed for age/gender General Gait Details: Mild antalgic gait, min cues for sequencing. Good upright posture, becomes nauseated at about 25'   Stairs            Wheelchair Mobility    Modified Rankin (Stroke Patients Only)       Balance Overall balance assessment: Needs assistance Sitting-balance support: No upper extremity supported;Feet supported Sitting balance-Leahy Scale: Fair Sitting balance - Comments: sitting EOB no back support   Standing balance support: No upper extremity supported;During functional activity Standing balance-Leahy Scale: Fair Standing balance comment: able to stand statically at sink to brush teeth and wash face                    Cognition Arousal/Alertness: Awake/alert Behavior During Therapy: WFL for tasks assessed/performed Overall Cognitive Status: Within Functional Limits for tasks assessed                      Exercises Total Joint Exercises Ankle Circles/Pumps: AROM;Both;20 reps;Supine Quad Sets: AROM;Right;10 reps;Supine Towel Squeeze: AROM;Both;10 reps;Supine Heel Slides: AAROM;Right;10 reps;Supine Goniometric ROM: 5-70    General Comments        Pertinent Vitals/Pain Pain Assessment: 0-10 Pain Score: 8  Pain Location: right knee Pain Descriptors / Indicators: Dull;Aching Pain Intervention(s): Monitored during session;RN gave pain meds during session;Ice applied;Repositioned    Home Living                      Prior  Function            PT Goals (current goals can now be found in the care plan section) Acute Rehab PT Goals Patient Stated Goal: to get home  Progress towards PT goals: Progressing toward goals    Frequency    7X/week      PT Plan Current plan remains appropriate    Co-evaluation             End of Session Equipment Utilized During  Treatment: Gait belt Activity Tolerance: Patient tolerated treatment well;Patient limited by pain Patient left: in bed;in CPM;with call bell/phone within reach;with SCD's reapplied     Time: [id number] PT Time Calculation (min) (ACUTE ONLY): 41 min  Charges:  $Gait Training: 8-22 mins $Therapeutic Exercise: 8-22 mins $Therapeutic Activity: 8-22 mins                    G Codes:      Scheryl Marten PT, DPT  [phone]  08/01/2016, 8:51 AM

## 2016-08-01 NOTE — Evaluation (Signed)
Occupational Therapy Evaluation and Discharge Patient Details Name: Lori [name] MRN: [id number] DOB: 1961-01-26 Today's Date: 08/01/2016    History of Present Illness 56 yo female admitted on 07/31/16 for R TKA. PMH significant for OA, GERD, DM, Hypothyroid, L TKA 07/26/15.    Clinical Impression   PTA pt independent in ADL and mobility. Pt currently mod assist for LB ADL and supervision for mobility with RW. Pt with LLE TKA last year and familiar with compensatory strategies and recovery process. Please see performance level below. Pt fully assessed and received all education and at adequate level for dc from OT perspective. Thank you for this referral, OT to sign off at this time.    Follow Up Recommendations  No OT follow up;Supervision - Intermittent    Equipment Recommendations  None recommended by OT    Recommendations for Other Services       Precautions / Restrictions Precautions Precautions: Knee Precaution Booklet Issued: Yes (comment) Precaution Comments: handout given and reviewed no pillow under knee Required Braces or Orthoses: Knee Immobilizer - Right Knee Immobilizer - Right: Other (comment) Restrictions Weight Bearing Restrictions: Yes RLE Weight Bearing: Weight bearing as tolerated      Mobility Bed Mobility Overal bed mobility: Needs Assistance Bed Mobility: Sit to Supine     Supine to sit: Min assist;HOB elevated Sit to supine: Min assist;HOB elevated   General bed mobility comments: Min A for RLE back into bed  Transfers Overall transfer level: Needs assistance Equipment used: Rolling walker (2 wheeled) Transfers: Sit to/from Stand Sit to Stand: Supervision         General transfer comment: good hand placement this session    Balance Overall balance assessment: Needs assistance Sitting-balance support: No upper extremity supported;Feet supported Sitting balance-Leahy Scale: Good Sitting balance - Comments: sitting EOB no back  support   Standing balance support: No upper extremity supported;During functional activity Standing balance-Leahy Scale: Fair Standing balance comment: sink level grooming with no LOB                            ADL Overall ADL's : Needs assistance/impaired Eating/Feeding: Modified independent;Sitting   Grooming: Wash/dry hands;Supervision/safety;Standing Grooming Details (indicate cue type and reason): sink level Upper Body Bathing: Supervision/ safety;Sitting   Lower Body Bathing: Moderate assistance;Sitting/lateral leans   Upper Body Dressing : Modified independent;Sitting   Lower Body Dressing: Moderate assistance;With caregiver independent assisting;Sit to/from stand   Toilet Transfer: Supervision/safety;BSC (BSC over toilet) Toilet Transfer Details (indicate cue type and reason): BSC over comfort height toilet Toileting- Clothing Manipulation and Hygiene: Supervision/safety;Sit to/from stand   Tub/ Shower Transfer: Walk-in shower;Supervision/safety;With caregiver independent assisting;Ambulation;Rolling walker Tub/Shower Transfer Details (indicate cue type and reason): educated that Pt should have caregiver present for safety at first Functional mobility during ADLs: Supervision/safety;Rolling walker General ADL Comments: Pt familiar with compensatory strategies from LLE TKA     Vision Vision Assessment?: No apparent visual deficits   Perception     Praxis      Pertinent Vitals/Pain Pain Assessment: 0-10 Pain Score: 8  Pain Location: right knee Pain Descriptors / Indicators: Dull;Aching Pain Intervention(s): Monitored during session     Hand Dominance Right   Extremity/Trunk Assessment Upper Extremity Assessment Upper Extremity Assessment: Overall WFL for tasks assessed   Lower Extremity Assessment Lower Extremity Assessment: RLE deficits/detail RLE Deficits / Details: pt with normal post op pain and weakness.       Communication  Communication Communication:  No difficulties   Cognition Arousal/Alertness: Awake/alert Behavior During Therapy: WFL for tasks assessed/performed Overall Cognitive Status: Within Functional Limits for tasks assessed                     General Comments       Exercises      Shoulder Instructions      Home Living Family/patient expects to be discharged to:: Private residence Living Arrangements: Spouse/significant other Available Help at Discharge: Family;Available 24 hours/day Type of Home: House Home Access: Stairs to enter [employer] of Steps: 3 Entrance Stairs-Rails: Left Home Layout: Two level;Able to live on main level with bedroom/bathroom Alternate Level Stairs-Number of Steps: flight   Bathroom Shower/Tub: Walk-in shower;Door   [employer] Toilet: Handicapped height Bathroom Accessibility: Yes How Accessible: Accessible via walker Home Equipment: [place] - 2 wheels;Bedside commode          Prior Functioning/Environment Level of Independence: Independent        Comments: was working on the farm prior to surgery; retired Engineer, structural as well        OT Problem List: Decreased strength;Decreased range of motion;Decreased activity tolerance;Impaired balance (sitting and/or standing);Pain   OT Treatment/Interventions:      OT Goals(Current goals can be found in the care plan section) Acute Rehab OT Goals Patient Stated Goal: to get home  OT Goal Formulation: With patient Time For Goal Achievement: 08/08/16 Potential to Achieve Goals: Good  OT Frequency:     Barriers to D/C:            Co-evaluation              End of Session Equipment Utilized During Treatment: Gait belt;Rolling walker CPM Right Knee CPM Right Knee: Off Nurse Communication: Mobility status;Patient requests pain meds  Activity Tolerance: Patient tolerated treatment well Patient left: in bed;with call bell/phone within reach   Time: 1535-1558 OT Time  Calculation (min): 23 min Charges:  OT General Charges $OT Visit: 1 Procedure OT Evaluation $OT Eval Moderate Complexity: 1 Procedure OT Treatments $Self Care/Home Management : 8-22 mins G-Codes:    Merri Ray Tauni Sanks August 13, 2016, 4:06 PM Hulda Humphrey OTR/L [phone]

## 2016-08-01 NOTE — Op Note (Signed)
Lori [name], [name]                ACCOUNT NO.:  [id number]  MEDICAL RECORD NO.:  [id number]  LOCATION:  [facility]                         FACILITY:  [facility]  PHYSICIAN:  Doran Heater. Veverly Fells, M.D. DATE OF BIRTH:  01-08-1961  DATE OF PROCEDURE:  07/31/2016 DATE OF DISCHARGE:                              OPERATIVE REPORT   PREOPERATIVE DIAGNOSIS:  Right knee end-stage osteoarthritis.  POSTOPERATIVE DIAGNOSIS:  Right knee end-stage osteoarthritis.  PROCEDURE PERFORMED:  Right total knee arthroplasty using DePuy Sigma rotating platform prosthesis.  ATTENDING SURGEON:  Doran Heater. Veverly Fells, MD.  ASSISTANT:  Abbott Pao. Dixon, PA-C, who has scrubbed the entire procedure and necessary for satisfactory completion of surgery.  ANESTHESIA:  Regional anesthesia plus spinal was used.  ESTIMATED BLOOD LOSS:  Minimal.  FLUID REPLACEMENT:  1500 mL crystalloid.  TOURNIQUET TIME:  1 hour and 15 minutes at 325 mmHg.  Instrument counts correct.  No complications.  Perioperative antibiotics were given.  INDICATIONS:  The patient is a 56 year old female with worsening right knee pain secondary to end-stage arthritis.  The patient has bone-on- bone on x-ray, and has had progressive pain despite conservative management.  She is status post left total knee replacement, for which she has done well.  She presents now for right total knee arthroplasty to improve range of motion, function, and eliminate pain.  Informed consent obtained.  DESCRIPTION OF PROCEDURE:  After an adequate level of anesthesia achieved, the patient positioned supine on the operating room table. Right leg correctly identified.  Nonsterile tourniquet placed on the proximal thigh, right leg sterilely prepped and draped in the usual manner.  Time-out called.  We elevated the leg, exsanguinated using Esmarch bandage, and elevated the tourniquet to 325 mmHg, placed the knee in flexion.  A longitudinal midline incision was created after again  reverifying time-out.  Dissection down through subcutaneous tissues.  We identified the parapatellar tissues, and performed a median parapatellar arthrotomy with a fresh #10 blade scalpel.  We everted the patella, divided the lateral patellofemoral ligaments, and extended the distal femur with a step-cut drill.  We placed our intramedullary resection guide resecting 10 mm of bone off the affected right distal femur set on 5 degrees right.  We then went ahead and sized the femur size 3 anterior down.  We then placed our 4-in-1 block and performed our anterior, posterior, and chamfer cuts with a nice flush cut on the anterior aspect of the femur.  Next, we went to the tibia.  We subluxed the tibia anteriorly, and then performed a 90-degree perpendicular cut, taking 4 mm of bone off the affected medial side.  We next went ahead and placed a laminar spreader and removed excess posterior osteophytes off the posterior femur and some capsule off the notch.  We removed ACL, PCL, meniscal tissues.  At this point, we checked our flexion-extension gaps, which were symmetric at 10 mm.  We then went ahead and completed our tibial preparation with a modular drill and keel punch, placed our trial 3 tibia.  We then used our box cut for the femur.  We performed the box cut with the oscillating saw.  We then placed our size 3 right  femur in place, and then reduced the knee with a 3+ 10 poly.  We were pleased with that soft tissue balancing, attaining full extension, excellent flexion.  We then resurfaced the patella going from a 24 and 3 mm thickness down to about 15 to 16 mm thickness using the patellar jig, and cut that with the oscillating saw.  We then drilled our lug holes for a 32 patella, and then placed our 32 patellar trial, and then ranged the knee.  There was just a slight amount of tilt in deep flexion, so we went ahead, and there was no subluxation whatsoever, so we did go ahead and do a  lateral release.  With that lateral release done, there was no tilting at all in deep flexion.  We then removed all trial components, pulse irrigated the knee, dried the bone, and then cemented the components into place with DePuy high-viscosity cement vacuum mixed, and then placed the knee in full extension with a +10 trial.  Once the cement was allowed to harden, we had to protect the patellar clamp as well.  We removed all excess cement with a quarter-inch curved osteotome and a tonsil clamp.  We inspected the posterior aspect of the knee.  We did a trial with 12.5, and felt like we could get the 12.5 in, and obtain full extension, and had excellent stability in flexion.  We removed the trial 12.5 and selected the real 12.5 poly.  Final inspection for any other loose cement, none was noted.  We placed a 12.5 poly, and then reduced the knee with a nice pop medially.  We were able to get the knee into full extension and excellent flexion, stability, and no patellar tilting at all with no-touch technique.  We irrigated the knee with pulse irrigator.  Thorough irrigation and closure with #1 Vicryl suture interrupted for the medial parapatellar arthrotomy followed by 2-0 Vicryl subcutaneous closure and 4-0 Monocryl for skin. Steri-Strips applied followed by sterile dressing.  The patient tolerated the surgery well.     Doran Heater. Veverly Fells, M.D.     SRN/MEDQ  D:  07/31/2016  T:  08/01/2016  Job:  [id number]

## 2016-08-01 NOTE — Progress Notes (Addendum)
Foley removed , pt tolerated well . 1750 output.  Pt advised to let staff know when she has to go to urine for accurate counts . She verblaize understanding

## 2016-08-01 NOTE — Care Management Note (Signed)
56 yo F s/p R TKA.  Received referral to assist with [facility] and DME.  PT is recommending HHPT.  Met with pt at bedside. She plans to return home with the support of her husband. She has a RW and a 3-in-1 BSC from previous knee surgery. She stated that she is going to outpt rehab and the surgeon is agreeable.

## 2016-08-01 NOTE — Progress Notes (Signed)
Physical Therapy Treatment Patient Details Name: Lori [name] MRN: [id number] DOB: December 19, 1960 Today's Date: 08/01/2016    History of Present Illness 56 yo female admitted on 07/31/16 for R TKA. PMH significant for OA, GERD, DM, Hypothyroid, L TKA 07/26/15.     PT Comments    Pt presents with improved mobility during PM session, but continues to have increased lethargy possibly from medications. Pt is able to perform gait to bathroom and inside room with min guard for safety. Improved sequencing with cueing for heel strike RLE. Pt would like to go to outpatient therapy upon discharge vs HHPT and already has her appointment set up.    Follow Up Recommendations  Outpatient PT     Equipment Recommendations  None recommended by PT    Recommendations for Other Services       Precautions / Restrictions Precautions Precautions: Knee Precaution Booklet Issued: Yes (comment) Precaution Comments: handout given and reviewed no pillow under knee Required Braces or Orthoses: Knee Immobilizer - Right Knee Immobilizer - Right: Other (comment) Restrictions Weight Bearing Restrictions: Yes RLE Weight Bearing: Weight bearing as tolerated    Mobility  Bed Mobility Overal bed mobility: Needs Assistance Bed Mobility: Supine to Sit     Supine to sit: Min assist;HOB elevated     General bed mobility comments: Min A for RLE OOB  Transfers Overall transfer level: Needs assistance Equipment used: Rolling walker (2 wheeled) Transfers: Sit to/from Stand Sit to Stand: Min guard         General transfer comment: Min guard for safety. Trace cues for hand placement  Ambulation/Gait Ambulation/Gait assistance: Min guard Ambulation Distance (Feet): 75 Feet Assistive device: Rolling walker (2 wheeled) Gait Pattern/deviations: Step-through pattern;Decreased step length - left;Decreased stance time - right;Antalgic Gait velocity: decreased Gait velocity interpretation: Below normal speed for  age/gender General Gait Details: Mild antalgic gait, min cues for sequencing. Good upright posture, becomes nauseated at about 25'   Stairs            Wheelchair Mobility    Modified Rankin (Stroke Patients Only)       Balance                                    Cognition Arousal/Alertness: Awake/alert Behavior During Therapy: WFL for tasks assessed/performed Overall Cognitive Status: Within Functional Limits for tasks assessed                      Exercises Total Joint Exercises Short Arc QuadSinclair Ship;Right;10 reps;Supine Hip ABduction/ADduction: AAROM;Right;10 reps;Supine Straight Leg Raises: AAROM;Right;10 reps;Supine    General Comments        Pertinent Vitals/Pain Pain Assessment: 0-10 Pain Score: 5  Pain Location: right knee Pain Descriptors / Indicators: Dull;Aching Pain Intervention(s): Monitored during session;Premedicated before session;Repositioned;Ice applied    Home Living                      Prior Function            PT Goals (current goals can now be found in the care plan section) Acute Rehab PT Goals Patient Stated Goal: to get home  Progress towards PT goals: Progressing toward goals    Frequency    7X/week      PT Plan Current plan remains appropriate    Co-evaluation             End  of Session Equipment Utilized During Treatment: Gait belt Activity Tolerance: Patient tolerated treatment well Patient left: in chair;with call bell/phone within reach     Time: 1305-1329 PT Time Calculation (min) (ACUTE ONLY): 24 min  Charges:  $Gait Training: 8-22 mins $Therapeutic Exercise: 8-22 mins                    G Codes:      Scheryl Marten PT, DPT  [phone]  08/01/2016, 1:33 PM

## 2016-08-02 LAB — CBC
HCT: 37.7 % (ref 36.0–46.0)
Hemoglobin: 13 g/dL (ref 12.0–15.0)
MCH: 31.2 pg (ref 26.0–34.0)
MCHC: 34.5 g/dL (ref 30.0–36.0)
MCV: 90.4 fL (ref 78.0–100.0)
Platelets: 166 10*3/uL (ref 150–400)
RBC: 4.17 MIL/uL (ref 3.87–5.11)
RDW: 13.2 % (ref 11.5–15.5)
WBC: 8.8 10*3/uL (ref 4.0–10.5)

## 2016-08-02 MED ORDER — POLYETHYLENE GLYCOL 3350 17 G PO PACK: 17.0000 g | PACK | Freq: Every day | ORAL | 0 refills | Status: DC | PRN

## 2016-08-02 MED ORDER — DOCUSATE SODIUM 100 MG PO CAPS: 100.0000 mg | ORAL_CAPSULE | Freq: Two times a day (BID) | ORAL | 0 refills | Status: DC

## 2016-08-02 NOTE — Progress Notes (Signed)
Physical Therapy Treatment Patient Details Name: Lori [name] MRN: [id number] DOB: November 07, 1960 Today's Date: 08/02/2016    History of Present Illness 56 yo female admitted on 07/31/16 for R TKA. PMH significant for OA, GERD, DM, Hypothyroid, L TKA 07/26/15.     PT Comments    Patient is progressing well toward mobility goals. Tolerated increased gait distance and stair training. HEP handout given and reviewed. Continue to progress as tolerated.   Follow Up Recommendations  Outpatient PT     Equipment Recommendations  None recommended by PT    Recommendations for Other Services       Precautions / Restrictions Precautions Precautions: Knee Precaution Booklet Issued: Yes (comment) Precaution Comments: handout given and reviewed no pillow under knee Restrictions Weight Bearing Restrictions: Yes RLE Weight Bearing: Weight bearing as tolerated    Mobility  Bed Mobility Overal bed mobility: Needs Assistance Bed Mobility: Supine to Sit     Supine to sit: Min assist     General bed mobility comments: assist to lower R LE from EOB  Transfers Overall transfer level: Needs assistance Equipment used: Rolling walker (2 wheeled) Transfers: Sit to/from Stand Sit to Stand: Min guard         General transfer comment: min guard for safety  Ambulation/Gait Ambulation/Gait assistance: Supervision Ambulation Distance (Feet): 200 Feet Assistive device: Rolling walker (2 wheeled) Gait Pattern/deviations: Step-through pattern;Decreased stride length;Antalgic Gait velocity: decreased   General Gait Details: slow, steady gait; cues for sequencing and proximity of RW   Stairs Stairs: Yes   Stair Management: One rail Left;Forwards;Step to pattern Number of Stairs: 3 General stair comments: cues for sequencing and technique  Wheelchair Mobility    Modified Rankin (Stroke Patients Only)       Balance     Sitting balance-Leahy Scale: Good       Standing  balance-Leahy Scale: Fair                      Cognition Arousal/Alertness: Awake/alert Behavior During Therapy: WFL for tasks assessed/performed Overall Cognitive Status: Within Functional Limits for tasks assessed                      Exercises Total Joint Exercises Quad Sets: AROM;Right;10 reps;Supine Heel Slides: AROM;Right;10 reps;Supine Hip ABduction/ADduction: Right;10 reps;Supine;AROM Straight Leg Raises: AAROM;Right;10 reps;Supine Goniometric ROM: 80 degrees flexion    General Comments General comments (skin integrity, edema, etc.): reviewed HEP handout and encouraged pt to work on knee flexion every hour and stressed importance of resting in knee extension      Pertinent Vitals/Pain Pain Assessment: Faces Faces Pain Scale: Hurts little more Pain Location: right knee Pain Descriptors / Indicators: Sore;Guarding Pain Intervention(s): Limited activity within patient's tolerance;Monitored during session;Premedicated before session;Repositioned    Home Living                      Prior Function            PT Goals (current goals can now be found in the care plan section) Acute Rehab PT Goals Patient Stated Goal: to get home  Progress towards PT goals: Progressing toward goals    Frequency    7X/week      PT Plan Current plan remains appropriate    Co-evaluation             End of Session Equipment Utilized During Treatment: Gait belt Activity Tolerance: Patient tolerated treatment well Patient left: in chair;with  call bell/phone within reach     Time: 1135-1209 PT Time Calculation (min) (ACUTE ONLY): 34 min  Charges:  $Gait Training: 8-22 mins $Therapeutic Exercise: 8-22 mins                    G Codes:      Salina April, PTA Pager: [phone]   08/02/2016, 1:33 PM

## 2016-08-02 NOTE — Progress Notes (Signed)
Patient has a new onset of vaginal bleeding.  She noticed this morning while bathing.  She states she is concerned because she began menopause at age 56.  This has occurred once before in 2016, per patient her GYN had to perform a "wash-out".  Paged the on-call, awaiting feedback.

## 2016-08-02 NOTE — Progress Notes (Signed)
Followed up with PA regarding vaginal bleeding.  Per PA, patient will need to continue aspirin.  Patient strongly encouraged to follow up on Monday with OB/GYN.

## 2016-08-02 NOTE — Progress Notes (Signed)
Patient ID: Lori [name], female   DOB: Nov 22, 1960, 56 y.o.   MRN: [id number] Subjective: 2 Days Post-Op Procedure(s) (LRB): TOTAL KNEE ARTHROPLASTY (Right)    Patient reports pain as mild to moderate, tolerating being in CPM for now  Objective:   VITALS:   Vitals:   08/01/16 2108 08/02/16 0451  BP: 133/62 134/68  Pulse: 86 84  Resp:    Temp: 98.9 F (37.2 C) 98.9 F (37.2 C)    Neurovascular intact  ACE wrap still on leg, c/d/i - will have nursing remove prior to discharge and place Mepilex  LABS  Recent Labs  08/01/16 0614 08/02/16 0525  HGB 12.7 13.0  HCT 37.5 37.7  WBC 9.6 8.8  PLT 186 166     Recent Labs  08/01/16 0614  NA 136  K 3.8  BUN 9  CREATININE 0.86  GLUCOSE 129*    No results for input(s): LABPT, INR in the last 72 hours.   Assessment/Plan: 2 Days Post-Op Procedure(s) (LRB): TOTAL KNEE ARTHROPLASTY (Right)   Up with therapy  Discharge to home, reviewed plans - to start outpt PT in [facility] Tuesday Has been through left TKR, knows what it takes Rx on chart

## 2016-08-02 NOTE — Progress Notes (Signed)
Orthopedic Tech Progress Note Patient Details:  Lori [name] [birth year] [id number]  Patient ID: Lori [name], female   DOB: Apr 16, 1961, 56 y.o.   MRN: [id number] applied cpm 0-70  [name] 08/02/2016, 6:16 AM

## 2016-08-02 NOTE — Progress Notes (Signed)
Discharge instructions and prescription provided to patient.  IV removed.  MD paged for nausea med to be sent to pharmacy, per patient request.

## 2016-08-03 ENCOUNTER — Encounter (HOSPITAL_COMMUNITY): Payer: Self-pay | Admitting: Orthopedic Surgery

## 2016-08-04 NOTE — Discharge Summary (Signed)
Physician Discharge Summary   Patient ID: Lori [name] MRN: [id number] DOB/AGE: August 05, 1960 56 y.o.  Admit date: 07/31/2016 Discharge date: 08/02/2016  Admission Diagnoses:  Active Problems:   H/O total knee replacement, right   Discharge Diagnoses:  Same   Surgeries: Procedure(s): TOTAL KNEE ARTHROPLASTY on 07/31/2016   Consultants: PT/OT  Discharged Condition: Stable  Hospital Course: Lori [name] is an 56 y.o. female who was admitted 07/31/2016 with a chief complaint of right knee pain, and found to have a diagnosis of right knee end stage osteoarthritis.  They were brought to the operating room on 07/31/2016 and underwent the above named procedures.    The patient had an uncomplicated hospital course and was stable for discharge.  Recent vital signs:  Vitals:   08/01/16 2108 08/02/16 0451  BP: 133/62 134/68  Pulse: 86 84  Resp:    Temp: 98.9 F (37.2 C) 98.9 F (37.2 C)    Recent laboratory studies:  Results for orders placed or performed during the hospital encounter of 07/31/16  Glucose, capillary  Result Value Ref Range   Glucose-Capillary 93 65 - 99 mg/dL  Glucose, capillary  Result Value Ref Range   Glucose-Capillary 103 (H) 65 - 99 mg/dL   Comment 1 Notify RN    Comment 2 Document in Chart   CBC  Result Value Ref Range   WBC 9.6 4.0 - 10.5 K/uL   RBC 4.15 3.87 - 5.11 MIL/uL   Hemoglobin 12.7 12.0 - 15.0 g/dL   HCT 37.5 36.0 - 46.0 %   MCV 90.4 78.0 - 100.0 fL   MCH 30.6 26.0 - 34.0 pg   MCHC 33.9 30.0 - 36.0 g/dL   RDW 13.3 11.5 - 15.5 %   Platelets 186 150 - 400 K/uL  Basic metabolic panel  Result Value Ref Range   Sodium 136 135 - 145 mmol/L   Potassium 3.8 3.5 - 5.1 mmol/L   Chloride 103 101 - 111 mmol/L   CO2 29 22 - 32 mmol/L   Glucose, Bld 129 (H) 65 - 99 mg/dL   BUN 9 6 - 20 mg/dL   Creatinine, Ser 0.86 0.44 - 1.00 mg/dL   Calcium 9.0 8.9 - 10.3 mg/dL   GFR calc non Af Amer >60 >60 mL/min   GFR calc Af Amer >60 >60 mL/min   Anion  gap 4 (L) 5 - 15  CBC  Result Value Ref Range   WBC 8.8 4.0 - 10.5 K/uL   RBC 4.17 3.87 - 5.11 MIL/uL   Hemoglobin 13.0 12.0 - 15.0 g/dL   HCT 37.7 36.0 - 46.0 %   MCV 90.4 78.0 - 100.0 fL   MCH 31.2 26.0 - 34.0 pg   MCHC 34.5 30.0 - 36.0 g/dL   RDW 13.2 11.5 - 15.5 %   Platelets 166 150 - 400 K/uL    Discharge Medications:   Allergies as of 08/02/2016      Reactions   Other Nausea And Vomiting   Strong pain medications must be taken with nausea medicine  PRE-DIABETIC CONTROLLED THROUGH DIET   Celebrex [celecoxib] Other (See Comments)   Gives her a 'really bad headache'      Medication List    STOP taking these medications   ALEVE 220 MG tablet Generic drug:  naproxen sodium     TAKE these medications   aspirin EC 325 MG tablet Take 1 tablet (325 mg total) by mouth 2 (two) times daily.   aspirin-acetaminophen-caffeine 250-250-65 MG tablet  Commonly known as:  EXCEDRIN MIGRAINE Take 2 tablets by mouth every 6 (six) hours as needed for migraine.   bimatoprost 0.01 % Soln Commonly known as:  LUMIGAN Place 1 drop into both eyes at bedtime.   clonazePAM 1 MG tablet Commonly known as:  KLONOPIN Take 0.5-1 mg by mouth at bedtime as needed. For restless leg syndrome   docusate sodium 100 MG capsule Commonly known as:  COLACE Take 1 capsule (100 mg total) by mouth 2 (two) times daily.   ezetimibe 10 MG tablet Commonly known as:  ZETIA Take 10 mg by mouth daily.   levothyroxine 50 MCG tablet Commonly known as:  SYNTHROID, LEVOTHROID Take 50 mcg by mouth daily before breakfast.   methocarbamol 500 MG tablet Commonly known as:  ROBAXIN Take 1 tablet (500 mg total) by mouth 3 (three) times daily as needed.   montelukast 10 MG tablet Commonly known as:  SINGULAIR Take 10 mg by mouth at bedtime.   multivitamin with minerals tablet Take 1 tablet by mouth daily.   PRESERVISION AREDS 2 PO Take 1 capsule by mouth 2 (two) times daily.   OMEGA-3 COMPLEX PO Take 1  capsule by mouth 2 (two) times daily.   OSTEO BI-FLEX ADV DOUBLE ST Tabs Take 2 tablets by mouth every morning.   oxyCODONE-acetaminophen 5-325 MG tablet Commonly known as:  ROXICET Take 1-2 tablets by mouth every 4 (four) hours as needed for severe pain.   polyethylene glycol packet Commonly known as:  MIRALAX / GLYCOLAX Take 17 g by mouth daily as needed for mild constipation.   polyvinyl alcohol 1.4 % ophthalmic solution Commonly known as:  LIQUIFILM TEARS Place 1 drop into both eyes daily before breakfast.   QUNOL ULTRA COQ10 PO Take 1 capsule by mouth daily.   Selenium 200 MCG Tabs Take 200 mcg by mouth at bedtime.   ZETONNA 37 MCG/ACT Aers Generic drug:  Ciclesonide Place 1-2 sprays into the nose daily as needed (for nasal congestion).       Diagnostic Studies: Dg Knee Right Port  Result Date: 07/31/2016 CLINICAL DATA:  Imaging following right knee replacement. EXAM: PORTABLE RIGHT KNEE - 1-2 VIEW COMPARISON:  None. FINDINGS: The right knee prosthetic components appear well seated and well aligned. There is no acute fracture or evidence of an operative complication. IMPRESSION: Well-positioned right knee prosthesis. Electronically Signed   By: Lajean Manes M.D.   On: 07/31/2016 10:42    Disposition: 01-Home or Self Care  Discharge Instructions    Call MD / Call 911    Complete by:  As directed    If you experience chest pain or shortness of breath, CALL 911 and be transported to the hospital emergency room.  If you develope a fever above 101 F, pus (white drainage) or increased drainage or redness at the wound, or calf pain, call your surgeon's office.   Constipation Prevention    Complete by:  As directed    Drink plenty of fluids.  Prune juice may be helpful.  You may use a stool softener, such as Colace (over the counter) 100 mg twice a day.  Use MiraLax (over the counter) for constipation as needed.   Diet - low sodium heart healthy    Complete by:  As  directed    Discharge instructions    Complete by:  As directed    Keep knee wound as dry as possible when showering Work on knee motion 10-12 times a day Other instructions and follow  up as per [place] items at home which could result in a fall. This includes throw rugs or furniture in walking pathways ICE to the affected joint every three hours while awake for 30 minutes at a time, for at least the first 3-5 days, and then as needed for pain and swelling.  Continue to use ice for pain and swelling. You may notice swelling that will progress down to the foot and ankle.  This is normal after surgery.  Elevate your leg when you are not up walking on it.   Continue to use the breathing machine you got in the hospital (incentive spirometer) which will help keep your temperature down.  It is common for your temperature to cycle up and down following surgery, especially at night when you are not up moving around and exerting yourself.  The breathing machine keeps your lungs expanded and your temperature down.   DIET:  As you were doing prior to hospitalization, we recommend a well-balanced diet.  DRESSING / WOUND CARE / SHOWERING  You may shower 3 days after surgery, but keep the wounds dry during showering.  You may use an occlusive plastic wrap (Press'n Seal for example), NO SOAKING/SUBMERGING IN THE BATHTUB.  If the bandage gets wet, change with a clean dry gauze.  If the incision gets wet, pat the wound dry with a clean towel.  ACTIVITY  Increase activity slowly as tolerated, but follow the weight bearing instructions below.   No driving for 6 weeks or until further direction given by your physician.  You cannot drive while taking narcotics.  No lifting or carrying greater than 10 lbs. until further directed by your surgeon. Avoid periods of inactivity such as sitting longer than an hour when not asleep. This helps prevent blood clots.  You  may return to work once you are authorized by your doctor.     WEIGHT BEARING   Weight bearing as tolerated with assist device (walker, cane, etc) as directed, use it as long as suggested by your surgeon or therapist, typically at least 4-6 weeks.   EXERCISES  Results after joint replacement surgery are often greatly improved when you follow the exercise, range of motion and muscle strengthening exercises prescribed by your doctor. Safety measures are also important to protect the joint from further injury. Any time any of these exercises cause you to have increased pain or swelling, decrease what you are doing until you are comfortable again and then slowly increase them. If you have problems or questions, call your caregiver or physical therapist for advice.   Rehabilitation is important following a joint replacement. After just a few days of immobilization, the muscles of the leg can become weakened and shrink (atrophy).  These exercises are designed to build up the tone and strength of the thigh and leg muscles and to improve motion. Often times heat used for twenty to thirty minutes before working out will loosen up your tissues and help with improving the range of motion but do not use heat for the first two weeks following surgery (sometimes heat can increase post-operative swelling).   These exercises can be done on a training (exercise) mat, on the floor, on a table or on a bed. Use whatever works the best and is most comfortable for you.    Use music or television while you are exercising so that the exercises are a pleasant break in your day. This will make your  life better with the exercises acting as a break in your routine that you can look forward to.   Perform all exercises about fifteen times, three times per day or as directed.  You should exercise both the operative leg and the other leg as well.   Exercises include:   Quad Sets - Tighten up the muscle on the front of the thigh  (Quad) and hold for 5-10 seconds.   Straight Leg Raises - With your knee straight (if you were given a brace, keep it on), lift the leg to 60 degrees, hold for 3 seconds, and slowly lower the leg.  Perform this exercise against resistance later as your leg gets stronger.  Leg Slides: Lying on your back, slowly slide your foot toward your buttocks, bending your knee up off the floor (only go as far as is comfortable). Then slowly slide your foot back down until your leg is flat on the floor again.  Angel Wings: Lying on your back spread your legs to the side as far apart as you can without causing discomfort.  Hamstring Strength:  Lying on your back, push your heel against the floor with your leg straight by tightening up the muscles of your buttocks.  Repeat, but this time bend your knee to a comfortable angle, and push your heel against the floor.  You may put a pillow under the heel to make it more comfortable if necessary.   A rehabilitation program following joint replacement surgery can speed recovery and prevent re-injury in the future due to weakened muscles. Contact your doctor or a physical therapist for more information on knee rehabilitation.    CONSTIPATION  Constipation is defined medically as fewer than three stools per week and severe constipation as less than one stool per week.  Even if you have a regular bowel pattern at home, your normal regimen is likely to be disrupted due to multiple reasons following surgery.  Combination of anesthesia, postoperative narcotics, change in appetite and fluid intake all can affect your bowels.   YOU MUST use at least one of the following options; they are listed in order of increasing strength to get the job done.  They are all available over the counter, and you may need to use some, POSSIBLY even all of these options:    Drink plenty of fluids (prune juice may be helpful) and high fiber foods Colace 100 mg by mouth twice a day  Senokot for  constipation as directed and as needed Dulcolax (bisacodyl), take with full glass of water  Miralax (polyethylene glycol) once or twice a day as needed.  If you have tried all these things and are unable to have a bowel movement in the first 3-4 days after surgery call either your surgeon or your primary doctor.    If you experience loose stools or diarrhea, hold the medications until you stool forms back up.  If your symptoms do not get better within 1 week or if they get worse, check with your doctor.  If you experience "the worst abdominal pain ever" or develop nausea or vomiting, please contact the office immediately for further recommendations for treatment.   ITCHING:  If you experience itching with your medications, try taking only a single pain pill, or even half a pain pill at a time.  You can also use Benadryl over the counter for itching or also to help with sleep.   TED HOSE STOCKINGS:  Use stockings on both legs until for at  least 2 weeks or as directed by physician office. They may be removed at night for sleeping.  MEDICATIONS:  See your medication summary on the "After Visit Summary" that nursing will review with you.  You may have some home medications which will be placed on hold until you complete the course of blood thinner medication.  It is important for you to complete the blood thinner medication as prescribed.  PRECAUTIONS:  If you experience chest pain or shortness of breath - call 911 immediately for transfer to the hospital emergency department.   If you develop a fever greater that 101 F, purulent drainage from wound, increased redness or drainage from wound, foul odor from the wound/dressing, or calf pain - CONTACT YOUR SURGEON.                                                   FOLLOW-UP APPOINTMENTS:  If you do not already have a post-op appointment, please call the office for an appointment to be seen by your surgeon.  Guidelines for how soon to be seen are listed in  your "After Visit Summary", but are typically between 1-4 weeks after surgery.  OTHER INSTRUCTIONS:   Knee Replacement:  Do not place pillow under knee, focus on keeping the knee straight while resting. CPM instructions: 0-90 degrees, 2 hours in the morning, 2 hours in the afternoon, and 2 hours in the evening. Place foam block, curve side up under heel at all times except when in CPM or when walking.  DO NOT modify, tear, cut, or change the foam block in any way.  MAKE SURE YOU:  Understand these instructions.  Get help right away if you are not doing well or get worse.    Thank you for letting us be a part of your medical care team.  It is a privilege we respect greatly.  We hope these instructions will help you stay on track for a fast and full recovery!   Increase activity slowly as tolerated    Complete by:  As directed       Follow-up Information    NORRIS,STEVEN R, MD. Call in 2 weeks.   Specialty:  Orthopedic Surgery Why:  [id number] Contact information: [address] [place] [address] [id number]            Signed: Ventura Bruns 08/04/2016, 3:46 PM

## 2017-07-01 ENCOUNTER — Other Ambulatory Visit: Payer: Self-pay | Admitting: Internal Medicine

## 2017-07-01 DIAGNOSIS — R1011 Right upper quadrant pain: Secondary | ICD-10-CM

## 2017-07-01 DIAGNOSIS — R1013 Epigastric pain: Secondary | ICD-10-CM

## 2017-07-01 DIAGNOSIS — R1012 Left upper quadrant pain: Secondary | ICD-10-CM

## 2017-07-07 ENCOUNTER — Ambulatory Visit
Admission: RE | Admit: 2017-07-07 | Discharge: 2017-07-07 | Disposition: A | Discharge status: Home / Self Care | Payer: 59 | Source: Ambulatory Visit | Attending: Internal Medicine | Admitting: Internal Medicine

## 2017-07-07 DIAGNOSIS — R1012 Left upper quadrant pain: Secondary | ICD-10-CM

## 2017-07-07 DIAGNOSIS — R1013 Epigastric pain: Secondary | ICD-10-CM

## 2017-07-07 DIAGNOSIS — R1011 Right upper quadrant pain: Secondary | ICD-10-CM

## 2017-08-31 DIAGNOSIS — M25559 Pain in unspecified hip: Secondary | ICD-10-CM | POA: Insufficient documentation

## 2017-08-31 DIAGNOSIS — M79643 Pain in unspecified hand: Secondary | ICD-10-CM | POA: Insufficient documentation

## 2018-05-09 DIAGNOSIS — Z96651 Presence of right artificial knee joint: Secondary | ICD-10-CM | POA: Insufficient documentation

## 2019-08-24 ENCOUNTER — Other Ambulatory Visit: Payer: Self-pay | Admitting: Physician Assistant

## 2019-08-24 DIAGNOSIS — R197 Diarrhea, unspecified: Secondary | ICD-10-CM

## 2019-08-24 DIAGNOSIS — R1032 Left lower quadrant pain: Secondary | ICD-10-CM

## 2019-08-25 ENCOUNTER — Other Ambulatory Visit: Payer: Self-pay

## 2019-08-25 ENCOUNTER — Ambulatory Visit
Admission: RE | Admit: 2019-08-25 | Discharge: 2019-08-25 | Disposition: A | Discharge status: Home / Self Care | Payer: 59 | Source: Ambulatory Visit | Attending: Physician Assistant | Admitting: Physician Assistant

## 2019-08-25 DIAGNOSIS — R1032 Left lower quadrant pain: Secondary | ICD-10-CM

## 2019-08-25 DIAGNOSIS — R197 Diarrhea, unspecified: Secondary | ICD-10-CM

## 2019-08-25 MED ORDER — IOPAMIDOL (ISOVUE-300) INJECTION 61%
100.0000 mL | Freq: Once | INTRAVENOUS | Status: AC | PRN
  Administered 2019-08-25: 12:00:00 100 mL via INTRAVENOUS

## 2019-10-13 ENCOUNTER — Ambulatory Visit: Payer: 59 | Attending: Internal Medicine

## 2019-10-13 DIAGNOSIS — Z23 Encounter for immunization: Secondary | ICD-10-CM

## 2019-10-13 NOTE — Progress Notes (Signed)
   Covid-19 Vaccination Clinic  Name:  NOLAN SANDEFER    MRN: [id number] DOB: [birth year]  10/13/2019  Ms. Casher was observed post Covid-19 immunization for 15 minutes without incident. She was provided with Vaccine Information Sheet and instruction to access the V-Safe system.   Ms. Schnettler was instructed to call 911 with any severe reactions post vaccine: Marland Kitchen Difficulty breathing  . Swelling of face and throat  . A fast heartbeat  . A bad rash all over body  . Dizziness and weakness   Immunizations Administered    Name Date Dose VIS Date Route   Moderna COVID-19 Vaccine 10/13/2019 10:44 AM 0.5 mL 06/20/2019 Intramuscular   Manufacturer: Moderna   Lot: [id number]   [id number]

## 2019-11-21 ENCOUNTER — Ambulatory Visit: Payer: 59 | Attending: Internal Medicine

## 2019-11-21 DIAGNOSIS — Z23 Encounter for immunization: Secondary | ICD-10-CM

## 2019-11-21 NOTE — Progress Notes (Signed)
   Covid-19 Vaccination Clinic  Name:  Lori [name]    MRN: [id number] DOB: 07-11-1961  11/21/2019  [name] was observed post Covid-19 immunization for 30 minutes based on pre-vaccination screening without incident. She was provided with Vaccine Information Sheet and instruction to access the V-Safe system.   [name] was instructed to call 911 with any severe reactions post vaccine: Marland Kitchen Difficulty breathing  . Swelling of face and throat  . A fast heartbeat  . A bad rash all over body  . Dizziness and weakness   Immunizations Administered    Name Date Dose VIS Date Route   Moderna COVID-19 Vaccine 11/21/2019  8:05 AM 0.5 mL 06/2019 Intramuscular   Manufacturer: Moderna   Lot: [id number]   [id number]

## 2019-11-22 DIAGNOSIS — M545 Low back pain, unspecified: Secondary | ICD-10-CM | POA: Insufficient documentation

## 2020-01-09 ENCOUNTER — Other Ambulatory Visit: Payer: Self-pay | Admitting: Physician Assistant

## 2020-01-09 DIAGNOSIS — M545 Low back pain, unspecified: Secondary | ICD-10-CM

## 2020-02-10 ENCOUNTER — Other Ambulatory Visit: Payer: Self-pay

## 2020-02-10 ENCOUNTER — Ambulatory Visit
Admission: RE | Admit: 2020-02-10 | Discharge: 2020-02-10 | Disposition: A | Discharge status: Home / Self Care | Payer: 59 | Source: Ambulatory Visit | Attending: Physician Assistant | Admitting: Physician Assistant

## 2020-02-10 DIAGNOSIS — M545 Low back pain, unspecified: Secondary | ICD-10-CM

## 2020-02-22 DIAGNOSIS — M47816 Spondylosis without myelopathy or radiculopathy, lumbar region: Secondary | ICD-10-CM | POA: Insufficient documentation

## 2020-05-24 DIAGNOSIS — M5416 Radiculopathy, lumbar region: Secondary | ICD-10-CM | POA: Insufficient documentation

## 2020-08-02 ENCOUNTER — Other Ambulatory Visit: Payer: Self-pay | Admitting: Physician Assistant

## 2020-08-02 DIAGNOSIS — R1084 Generalized abdominal pain: Secondary | ICD-10-CM

## 2020-08-20 ENCOUNTER — Ambulatory Visit
Admission: RE | Admit: 2020-08-20 | Discharge: 2020-08-20 | Disposition: A | Discharge status: Home / Self Care | Payer: 59 | Source: Ambulatory Visit | Attending: Physician Assistant | Admitting: Physician Assistant

## 2020-08-20 DIAGNOSIS — R1084 Generalized abdominal pain: Secondary | ICD-10-CM

## 2020-08-30 ENCOUNTER — Ambulatory Visit: Payer: Self-pay | Admitting: Orthopedic Surgery

## 2020-09-06 ENCOUNTER — Other Ambulatory Visit: Payer: Self-pay

## 2020-09-16 ENCOUNTER — Other Ambulatory Visit: Payer: Self-pay

## 2020-09-16 ENCOUNTER — Encounter: Payer: Self-pay | Admitting: Vascular Surgery

## 2020-09-16 ENCOUNTER — Ambulatory Visit: Payer: 59 | Admitting: Vascular Surgery

## 2020-09-16 VITALS — BP 117/72 | HR 73 | Temp 98.2°F | Resp 12 | Ht 66.0 in | Wt 182.0 lb

## 2020-09-16 DIAGNOSIS — M1990 Unspecified osteoarthritis, unspecified site: Secondary | ICD-10-CM | POA: Insufficient documentation

## 2020-09-16 DIAGNOSIS — E663 Overweight: Secondary | ICD-10-CM | POA: Insufficient documentation

## 2020-09-16 DIAGNOSIS — R7989 Other specified abnormal findings of blood chemistry: Secondary | ICD-10-CM | POA: Insufficient documentation

## 2020-09-16 DIAGNOSIS — E7800 Pure hypercholesterolemia, unspecified: Secondary | ICD-10-CM | POA: Insufficient documentation

## 2020-09-16 DIAGNOSIS — N951 Menopausal and female climacteric states: Secondary | ICD-10-CM | POA: Insufficient documentation

## 2020-09-16 DIAGNOSIS — N879 Dysplasia of cervix uteri, unspecified: Secondary | ICD-10-CM | POA: Insufficient documentation

## 2020-09-16 DIAGNOSIS — E039 Hypothyroidism, unspecified: Secondary | ICD-10-CM | POA: Insufficient documentation

## 2020-09-16 DIAGNOSIS — M5136 Other intervertebral disc degeneration, lumbar region: Secondary | ICD-10-CM

## 2020-09-16 DIAGNOSIS — R933 Abnormal findings on diagnostic imaging of other parts of digestive tract: Secondary | ICD-10-CM | POA: Insufficient documentation

## 2020-09-16 DIAGNOSIS — K5730 Diverticulosis of large intestine without perforation or abscess without bleeding: Secondary | ICD-10-CM | POA: Insufficient documentation

## 2020-09-16 DIAGNOSIS — E119 Type 2 diabetes mellitus without complications: Secondary | ICD-10-CM | POA: Insufficient documentation

## 2020-09-16 DIAGNOSIS — R1012 Left upper quadrant pain: Secondary | ICD-10-CM | POA: Insufficient documentation

## 2020-09-16 DIAGNOSIS — R197 Diarrhea, unspecified: Secondary | ICD-10-CM | POA: Insufficient documentation

## 2020-09-16 DIAGNOSIS — K52831 Collagenous colitis: Secondary | ICD-10-CM | POA: Insufficient documentation

## 2020-09-16 DIAGNOSIS — G4700 Insomnia, unspecified: Secondary | ICD-10-CM | POA: Insufficient documentation

## 2020-09-16 DIAGNOSIS — K769 Liver disease, unspecified: Secondary | ICD-10-CM | POA: Insufficient documentation

## 2020-09-16 NOTE — Progress Notes (Signed)
[facility]  Patient name: Lori [name] MRN: [id number] DOB: 1961-03-02 Sex: female  REASON FOR CONSULT: Discuss exposure for L4-5 disc fusion with Dr. Rolena Infante  HPI: Lori [name] is a 60 y.o. female, who is here today for discussion of my role for exposure for L4-5 disc surgery with Dr. Rolena Infante.  She has had progressively severe back and right leg discomfort.  She is otherwise very active and this is keeping her from doing her daily activities.  She has failed conservative treatment and Dr. Rolena Infante is recommended fusion.  She has had prior laparoscopic cholecystectomy but no other intra-abdominal surgery.  She has no history of cardiac disease.  She has no history of arterial insufficiency.  Past Medical History:  Diagnosis Date  . Arthritis   . Costochondritis   . Elevated liver enzymes   . GERD (gastroesophageal reflux disease)   . Headache   . Hypothyroidism   . Pre-diabetes   . Restless leg syndrome   . Seasonal allergies     Family History  Problem Relation Age of Onset  . Cancer Mother   . Congestive Heart Failure Mother   . Cancer Father     SOCIAL HISTORY: Social History   Socioeconomic History  . Marital status: Married    Spouse name: Not on file  . Number of children: Not on file  . Years of education: Not on file  . Highest education level: Not on file  Occupational History  . Not on file  Tobacco Use  . Smoking status: Never Smoker  . Smokeless tobacco: Never Used  Substance and Sexual Activity  . Alcohol use: No    Comment: stopped  . Drug use: No  . Sexual activity: Not on file  Other Topics Concern  . Not on file  Social History Narrative  . Not on file   Social Determinants of Health   Financial Resource Strain: Not on file  Food Insecurity: Not on file  Transportation Needs: Not on file  Physical Activity: Not on file  Stress: Not on file  Social Connections: Not on file   Intimate Partner Violence: Not on file    Allergies  Allergen Reactions  . Other Nausea And Vomiting    Strong pain medications must be taken with nausea medicine  PRE-DIABETIC CONTROLLED THROUGH DIET  . Celebrex [Celecoxib] Other (See Comments)    Gives her a 'really bad headache'    Current Outpatient Medications  Medication Sig Dispense Refill  . aspirin-acetaminophen-caffeine (EXCEDRIN MIGRAINE) 250-250-65 MG tablet Take 2 tablets by mouth every 6 (six) hours as needed for migraine.    Marland Kitchen azelastine (ASTELIN) 0.1 % nasal spray Place 1 spray into both nostrils 2 (two) times daily as needed for rhinitis. Use in each nostril as directed    . bimatoprost (LUMIGAN) 0.01 % SOLN Place 1 drop into both eyes at bedtime.    . budesonide (ENTOCORT EC) 3 MG 24 hr capsule Take 3 mg by mouth every morning.    . clonazePAM (KLONOPIN) 1 MG tablet Take 0.5-1 mg by mouth at bedtime as needed. For restless leg syndrome    . Coenzyme Q10-Vitamin E (QUNOL ULTRA COQ10 PO) Take 1 capsule by mouth daily.    . DHA-EPA-Vitamin E (OMEGA-3 COMPLEX PO) Take 1 capsule by mouth 2 (two) times daily.    Marland Kitchen dicyclomine (BENTYL) 10 MG capsule Take 10 mg by mouth 3 (three) times daily as needed for spasms.    Marland Kitchen  ezetimibe (ZETIA) 10 MG tablet Take 10 mg by mouth daily.    Marland Kitchen levothyroxine (SYNTHROID, LEVOTHROID) 50 MCG tablet Take 50 mcg by mouth daily before breakfast.    . Misc Natural Products (OSTEO BI-FLEX ADV DOUBLE ST) TABS Take 2 tablets by mouth every morning.    . montelukast (SINGULAIR) 10 MG tablet Take 10 mg by mouth at bedtime.    . Multiple Vitamins-Minerals (MULTIVITAMIN WITH MINERALS) tablet Take 1 tablet by mouth daily.    . Multiple Vitamins-Minerals (PRESERVISION AREDS 2 PO) Take 1 capsule by mouth 2 (two) times daily.    . polyvinyl alcohol (LIQUIFILM TEARS) 1.4 % ophthalmic solution Place 1 drop into both eyes daily before breakfast.    . Selenium 200 MCG TABS Take 200 mcg by mouth at bedtime.      No current facility-administered medications for this visit.    REVIEW OF SYSTEMS:  [X]  denotes positive finding, [ ]  denotes negative finding Cardiac  Comments:  Chest pain or chest pressure:    Shortness of breath upon exertion:    Short of breath when lying flat:    Irregular heart rhythm:        Vascular    Pain in calf, thigh, or hip brought on by ambulation:    Pain in feet at night that wakes you up from your sleep:     Blood clot in your veins:    Leg swelling:         Pulmonary    Oxygen at home:    Productive cough:     Wheezing:         Neurologic    Sudden weakness in arms or legs:     Sudden numbness in arms or legs:     Sudden onset of difficulty speaking or slurred speech:    Temporary loss of vision in one eye:     Problems with dizziness:         Gastrointestinal    Blood in stool:     Vomited blood:         Genitourinary    Burning when urinating:     Blood in urine:        Psychiatric    Major depression:         Hematologic    Bleeding problems:    Problems with blood clotting too easily:        Skin    Rashes or ulcers:        Constitutional    Fever or chills:      PHYSICAL EXAM: Vitals:   09/16/20 1315  BP: 117/72  Pulse: 73  Resp: 12  Temp: 98.2 F (36.8 C)  TempSrc: Other (Comment)  SpO2: 94%  Weight: 182 lb (82.6 kg)  Height: 5\' 6"  (1.676 m)    GENERAL: The patient is a well-nourished female, in no acute distress. The vital signs are documented above. CARDIOVASCULAR: 2+ radial 2+ femoral 2+ popliteal and 2+ dorsalis pedis pulses bilaterally Abdomen soft and nontender PULMONARY: There is good air exchange  MUSCULOSKELETAL: There are no major deformities or cyanosis. NEUROLOGIC: No focal weakness or paresthesias are detected. SKIN: There are no ulcers or rashes noted. PSYCHIATRIC: The patient has a normal affect.  DATA:  I have reviewed her CT scan from 08/24/2018 one of her abdomen and pelvis.  This revealed no  evidence of aortoiliac occlusive disease and normal location of her aortic and caval bifurcation  MEDICAL ISSUES: Had long discussion with the  patient regarding my role and exposure.  Explained that Dr. Rolena Infante is recommended surgery for treatment of her severe back pain.  I explained that this was through the oblique approach.  I described the positioning on the table for this and the expected area of the surgical incision in her left lower quadrant.  I described mobilization of intraperitoneal contents and arterial venous structures overlying the spine.  I discussed potential injury to all these and particularly emphasized potential injury to venous structures.  I feel that she is an acceptable risk for surgery. Her only prior abdominal surgery is laparoscopic cholecystectomy She is not morbidly obese She has no evidence of aortoiliac occlusive disease on CT scan.  We will plan proceeding with surgery on 09/25/2020   Rosetta Posner, MD [facility] [phone] Pager [phone]  Note: Portions of this report may have been transcribed using voice recognition software.  Every effort has been made to ensure accuracy; however, inadvertent computerized transcription errors may still be present.

## 2020-09-17 ENCOUNTER — Ambulatory Visit: Payer: Self-pay | Admitting: Orthopedic Surgery

## 2020-09-17 NOTE — H&P (Signed)
Subjective:   Lori [name] is a very pleasant 60 year old woman who is been very active but over the last several months her quality-of-life his deteriorated. Despite having 2 epidural steroid injections as well as a facet block and seeing a chiropractor on a regular basis her quality-of-life has failed to improve. Imaging studies demonstrated degenerative disc disease with a anterior listhesis grade 1 at L4-5. At this point time I do believe that the patient is suffering from discogenic back pain secondary to the spondylolisthesis. We have had a long discussion about treatment options at this point she is expressed a desire to move forward with surgery. I do believe that having seen a chiropractor twice a week for over a year as well as the 3 previous injections and a self directed exercise program constitutive failure of conservative management. She is scheduled for OLIF/PSFI L4-5 on 09/25/20 at [facility] with Dr. Rolena Infante, Dr. Donnetta Hutching will do the approach.  Patient Active Problem List   Diagnosis Date Noted  . Abnormal upper gastrointestinal barium series 09/16/2020  . 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 Collagenous colitis 09/16/2020  . Diabetes mellitus ([place]) 09/16/2020  . Diarrhea 09/16/2020  . Disease of liver 09/16/2020  . 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 Hypercholesterolemia 09/16/2020  . Hypothyroidism 09/16/2020  . Insomnia 09/16/2020  . Left upper quadrant pain 09/16/2020  . Menopausal symptom 09/16/2020  . Osteoarthritis 09/16/2020  . Other specified abnormal findings of blood chemistry 09/16/2020  . Overweight with body mass index (BMI) 25.0-29.9 09/16/2020  . Lumbar radiculopathy 05/24/2020  . Lumbar spondylosis 02/22/2020  . Low back pain 11/22/2019  . History of total knee replacement, right 05/09/2018  . Hand pain 08/31/2017  . Hip pain 08/31/2017  . History of total knee replacement, left 07/31/2016  . H/O total knee replacement 07/26/2015   Past Medical  History:  Diagnosis Date  . Arthritis   . Costochondritis   . Elevated liver enzymes   . GERD (gastroesophageal reflux disease)   . Hand pain   . Headache   . Hip pain   . Hypothyroidism   . Low back pain   . Lumbar radiculopathy   . Lumbar spondylosis   . Pre-diabetes   . Restless leg syndrome   . Seasonal allergies     Past Surgical History:  Procedure Laterality Date  . ANKLE ARTHROSCOPY     knee  . broken arm     left  . CATARACT EXTRACTION    . CHOLECYSTECTOMY    . DILATION AND CURETTAGE OF UTERUS    . TOTAL KNEE ARTHROPLASTY Left 07/26/2015  . TOTAL KNEE ARTHROPLASTY Left 07/26/2015   Procedure: LEFT TOTAL KNEE ARTHROPLASTY;  Surgeon: Netta Cedars, MD;  Location: [place];  Service: Orthopedics;  Laterality: Left;  . TOTAL KNEE ARTHROPLASTY Right 07/31/2016   Procedure: TOTAL KNEE ARTHROPLASTY;  Surgeon: Netta Cedars, MD;  Location: [place];  Service: Orthopedics;  Laterality: Right;    Current Outpatient Medications  Medication Sig Dispense Refill Last Dose  . amoxicillin (AMOXIL) 500 MG capsule amoxicillin 500 mg capsule  Take 4 capsule(s) 1 hour before procedure and 2 capsules day after procedure by oral route.     . Aspirin 325 MG CAPS aspirin 325 mg tablet,delayed release     . aspirin-acetaminophen-caffeine (EXCEDRIN MIGRAINE) 250-250-65 MG tablet Take 2 tablets by mouth every 6 (six) hours as needed for migraine.     Marland Kitchen azelastine (ASTELIN) 0.1 % nasal spray Place 1 spray into both nostrils 2 (two) times  daily as needed for rhinitis. Use in each nostril as directed     . bimatoprost (LUMIGAN) 0.01 % SOLN 1 drop.     . budesonide (ENTOCORT EC) 3 MG 24 hr capsule 3 capsules     . Calcium 150 MG TABS 2 tablets     . Ciclesonide (ZETONNA) 37 MCG/ACT AERS Zetonna 37 mcg/actuation nasal HFA inhaler  1 PUFF IN EACH NOSTRIL ONCE A DAY NASALLY 30 DAY(S)     . clonazePAM (KLONOPIN) 1 MG tablet 1 mg.     . Coenzyme Q10 (Q-10 CO-ENZYME PO) 1 capsue     . Coenzyme Q10-Vitamin E  (QUNOL ULTRA COQ10 PO) Take 1 capsule by mouth daily.     . DHA-EPA-Vitamin E (OMEGA-3 COMPLEX PO) Take 1 capsule by mouth 2 (two) times daily.     Marland Kitchen dicyclomine (BENTYL) 10 MG capsule 1 capsule     . Difluprednate (DUREZOL) 0.05 % EMUL Durezol 0.05 % eye drops     . diphenoxylate-atropine (LOMOTIL) 2.5-0.025 MG tablet diphenoxylate-atropine 2.5 mg-0.025 mg tablet  TAKE 1 TABLET BY MOUTH THREE TIMES A DAY FOR DIARRHEA     . ezetimibe (ZETIA) 10 MG tablet 10 mg.     . gabapentin (NEURONTIN) 100 MG capsule Take 100 mg by mouth 3 (three) times daily.     Marland Kitchen glucose blood test strip FreeStyle Test strips  TEST DAILY     . HYDROcodone-acetaminophen (NORCO/VICODIN) 5-325 MG tablet hydrocodone 5 mg-acetaminophen 325 mg tablet     . Krill Oil 500 MG CAPS 1 capsule     . levothyroxine (SYNTHROID) 50 MCG tablet 1 tab     . methocarbamol (ROBAXIN) 500 MG tablet methocarbamol 500 mg tablet     . Misc Natural Products (OSTEO BI-FLEX ADV DOUBLE ST) TABS Take 2 tablets by mouth every morning.     . montelukast (SINGULAIR) 10 MG tablet 1 tablet     . Multiple Vitamins-Minerals (PRESERVISION AREDS 2 PO) Take 1 capsule by mouth 2 (two) times daily.     . naproxen (NAPROSYN) 500 MG tablet naproxen 500 mg tablet     . naproxen sodium (ALEVE) 220 MG tablet 1 tablet with food or milk as needed     . naproxen sodium (ANAPROX) 550 MG tablet naproxen sodium 550 mg tablet  Take 1 tablet by mouth  every 8 hours     . nystatin cream (MYCOSTATIN) Apply topically.     . nystatin-triamcinolone (MYCOLOG II) cream nystatin-triamcinolone 100,000 unit/g-0.1 % topical cream  APPLY TO AFFECTED AREA TWICE A DAY IN THE MORNING AND IN THE EVENING     . omeprazole (PRILOSEC) 40 MG capsule 40 mg.     . ondansetron (ZOFRAN) 4 MG tablet ondansetron HCl 4 mg tablet     . polyethylene glycol (MIRALAX / GLYCOLAX) 17 g packet Take by mouth.     . polyethylene glycol-electrolytes (NULYTELY) 420 g solution peg-electrolyte solution 420 gram  oral solution  MIX AND DRINK AS DIRECTED     . polyvinyl alcohol (LIQUIFILM TEARS) 1.4 % ophthalmic solution Place 1 drop into both eyes daily before breakfast.     . predniSONE (DELTASONE) 10 MG tablet prednisone 10 mg tablet     . pregabalin (LYRICA) 50 MG capsule 1 capsule     . Prenatal Vit-Fe Fumarate-FA (M-VIT PO) 1 tablet     . saccharomyces boulardii (FLORASTOR) 250 MG capsule 1 capsule     . Selenium 200 MCG TABS Take 200 mcg by mouth at  bedtime.     . simvastatin (ZOCOR) 20 MG tablet simvastatin 20 mg tablet  TAKE 1 TABLET BY MOUTH AT BEDTIME FOR ELEVATED CHOLESTEROL     . triamcinolone (KENALOG) 0.1 % triamcinolone acetonide 0.1 % topical cream     . zolpidem (AMBIEN) 5 MG tablet zolpidem 5 mg tablet     . Zoster Vaccine Adjuvanted (SHINGRIX) injection Shingrix (PF) 50 mcg/0.5 mL intramuscular suspension, kit  TO BE ADMINISTERED BY PHARMACIST FOR IMMUNIZATION      No current facility-administered medications for this visit.   Allergies  Allergen Reactions  . Other Nausea And Vomiting, Nausea Only and Other (See Comments)    Strong pain medications must be taken with nausea medicine  PRE-DIABETIC CONTROLLED THROUGH DIET  . Codeine Nausea And Vomiting  . Celebrex [Celecoxib] Other (See Comments)    Gives her a 'really bad headache'    Social History   Tobacco Use  . Smoking status: Never Smoker  . Smokeless tobacco: Never Used  Substance Use Topics  . Alcohol use: No    Comment: stopped    Family History  Problem Relation Age of Onset  . Cancer Mother   . Congestive Heart Failure Mother   . Arthritis Mother   . Cancer Father   . Arthritis Father   . Diabetes Mellitus I Paternal Grandmother     Review of Systems Pertinent items are noted in HPI.  Objective:   Vitals: Ht: 5 ft 5 in 09/17/2020 01:18 pm Wt: 179 lbs 09/17/2020 01:19 pm BMI: 29.8 09/17/2020 01:19 pm BP: 123/63 sitting L arm 09/17/2020 01:21 pm Pulse: 65 bpm 09/17/2020 01:21 pm  Clinical  exam: Lori [name] is a pleasant individual, who appears younger than their stated age. She is alert and orientated 3. No shortness of breath, chest pain.  Heart: Regular rate and rhythm, no rubs, murmurs, or gallops  Lungs: Clear to auscultation bilaterally  Abdomen is soft and non-tender, negative loss of bowel and bladder control, no rebound tenderness. Negative: skin lesions abrasions contusions  Peripheral pulses: 2+ dorsalis pedis/posterior tibialis pulses bilaterally. Compartment soft and nontender.  Gait pattern: Normal  Assistive devices: None  Neuro: Positive numbness and dysesthesias into the right lower extremity. Positive straight leg raise test with reproduction and radicular leg pain. 5/5 motor strength in the lower extremity. Negative Babinski test, no clonus, 1+ deep tendon reflexes symmetrical in the lower extremity  Musculoskeletal: Severe low back pain with palpation and range of motion. Pain radiates from the midline into the paraspinal musculature. No SI joint pain with direct palpation, negative Patrick's test. X-rays of the lumbar spine include 2 views were reviewed. Patient has a degenerative slip at L4-5 with degenerative lumbar disc disease. No scoliosis is noted. No acute fracture seen.  MRI Impression: MRI of lumbar spine performed on 07/24/2020. Both images as well as the report were personally reviewed by myself as well as Dr. Rolena Infante. No significant change in this MRI compared to prior MRI. There is a grade 1 anterior listhesis of L4 on L5 causing moderate right and mild left L4-5 neural foraminal narrowing due to a bulging disc asymmetric to the right. Mild lumbar scoliosis.  Assessment:   Lincy is a very pleasant 60 year old woman who is been very active but over the last several months her quality-of-life his deteriorated. Despite having 2 epidural steroid injections as well as a facet block and seeing a chiropractor on a regular basis her quality-of-life has failed  to improve. Imaging studies demonstrated degenerative disc  disease with a anterior listhesis grade 1 at L4-5. At this point time I do believe that the patient is suffering from discogenic back pain secondary to the spondylolisthesis. We have had a long discussion about treatment options at this point she is expressed a desire to move forward with surgery. I do believe that having seen a chiropractor twice a week for over a year as well as the 3 previous injections and a self directed exercise program constitutive failure of conservative management. I recommended moving forward with an oblique lumbar interbody fusion L4-5 with supplemental posterior pedicle screw fixation.  Plan:    OLIF/XLIF risks, benefits of surgery were reviewed with the patient. These include: infection, bleeding, death, stroke, paralysis, ongoing or worse pain, need for additional surgery, injury to the lumbar plexus resulting in hip flexor weakness and difficulty walking without assistive devices. Adjacent segment degenerative disease, need for additional surgery including fusing other levels, leak of spinal fluid, Nonunion, hardware failure, breakage, or mal-position. Deep venous thrombosis (DVT) requiring additional treatment such as filter, and/or medications. Injury to abdominal contents, loss in bowel and bladder control. Risks and benefits of spinal fusion: Infection, bleeding, death, stroke, paralysis, ongoing or worse pain, need for additional surgery, nonunion, leak of spinal fluid, adjacent segment degeneration requiring additional fusion surgery, Injury to abdominal vessels that can require anterior surgery to stop bleeding. Malposition of the cage and/or pedicle screws that could require additional surgery. Loss of bowel and bladder control. Postoperative hematoma causing neurologic compression that could require urgent or emergent re-operation. The patient has expressed an understanding and a willingness to move forward  with surgery. She states that she recognizes the inherent risks of surgery but feels as though the benefits outweigh them. At this point I do agree. We have also discussed the post-operative recovery period to include: bathing/showering restrictions, wound healing, activity (and driving) restrictions, medications/pain mangement. We have also discussed post-operative redflags to include: signs and symptoms of postoperative infection, DVT/PE. We have gotten preoperative medical clearance from the patient's primary care provider. We have also gotten pre-op clearance from her GI specialist. Patient medication list. She is not on any blood thinners. No aspirin products. She is not taking any anti-inflammatory medications. She is holding vitamins and supplements as well. Patient has been evaluated by vascular surgeon. scheduled following this appointment for LSO brace fitting. Plan: Follow-up 2 weeks postop

## 2020-09-17 NOTE — H&P (Deleted)
  The note originally documented on this encounter has been moved the the encounter in which it belongs.  

## 2020-09-20 NOTE — Progress Notes (Addendum)
Surgical Instructions    Your procedure is scheduled on Wednesday 09/25/2020.  Report to [facility] Main Entrance "A" at 06:30 A.M., then check in with the Admitting office.   Call this number if you have problems the morning of surgery:  [phone]    If you have any questions prior to your surgery date call [phone]: Open Monday-Friday 8am-4pm    Remember:  Do not eat after midnight the night before your surgery  You may drink clear liquids until 05:30 the morning of your surgery.   Clear liquids allowed are: Water, Non-Citrus Juices (without pulp), Carbonated Beverages, Clear Tea, Black Coffee Only, and Gatorade    Take these medicines the morning of surgery with A SIP OF WATER: Budesonide (Entocort EC) Clonazepam (Klonopin) - if needed [facility] Eye drops Ezetimbe (Zetia) Levothyroxine (Synthroid) Montelukast (Singular) Omeprazole (Prilosec) Pregabalin (Lyrica)   If needed you may take the following medications the morning of surgery: Azelastine (Astelin) nasal spray   As of today, STOP taking any Aspirin or aspirin-containing products (unless otherwise instructed by your surgeon), Aleve, Naproxen, Ibuprofen, Motrin, Advil, Goody's, BC's, all herbal medications,supplements, fish oil, and all vitamins.  Follow your surgeon's instructions on when to stop Aspirin.  If no instructions were given by your surgeon then you will need to call the office to get those instructions.            Do NOT Smoke (Tobacco/Vaping) or drink Alcohol 24 hours prior to your procedure  If you use a CPAP at night, you may bring all equipment for your overnight stay.   Contacts, glasses, hearing aids, dentures or partials may not be worn into surgery, please bring cases for these belongings   For patients admitted to the hospital, discharge time will be determined by your treatment team.   Patients discharged the day of surgery will not be allowed to drive home, and someone needs to stay with  them for 24 hours.    Special instructions:   [facility]- Preparing For Surgery  Before surgery, you can play an important role. Because skin is not sterile, your skin needs to be as free of germs as possible. You can reduce the number of germs on your skin by washing with CHG (chlorahexidine gluconate) Soap before surgery.  CHG is an antiseptic cleaner which kills germs and bonds with the skin to continue killing germs even after washing.    Oral Hygiene is also important to reduce your risk of infection.  Remember - BRUSH YOUR TEETH THE MORNING OF SURGERY WITH YOUR REGULAR TOOTHPASTE  Please do not use if you have an allergy to CHG or antibacterial soaps. If your skin becomes reddened/irritated stop using the CHG.  Do not shave (including legs and underarms) for at least 48 hours prior to first CHG shower. It is OK to shave your face.  Please follow these instructions carefully.   You are going to shower with CHG soap 2 different times.  The NIGHT BEFORE SURGERY/PROCEDURE and then again the MORNING OF SURGERY/PROCEDURE   1. If you chose to wash your hair, wash your hair first as usual with your normal shampoo.  2. After you shampoo, rinse your hair and body thoroughly to remove the shampoo.  3. Wash Face and genitals (private parts) with your normal soap.   4. THEN Shower with CHG Soap.   5. Use CHG as you would any other liquid soap. You can apply CHG directly to the skin and wash gently with a pouf/sponge  or a clean washcloth.   6. Apply the CHG Soap to your body ONLY FROM THE NECK DOWN.  Do not use on open wounds or open sores. Avoid contact with your eyes, ears, mouth and genitals (private parts). Wash Face and genitals (private parts)  with your normal soap.   7. Wash thoroughly, paying special attention to the area where your surgery will be performed.  8. Thoroughly rinse your body with warm water from the neck down.  9. DO NOT shower/wash with your normal soap after using  and rinsing off the CHG Soap.  10. Pat yourself dry with a CLEAN TOWEL.  11. Wear CLEAN PAJAMAS to bed the night before surgery  12. Place CLEAN SHEETS on your bed the night before your surgery  13. DO NOT SLEEP WITH PETS.   Day of Surgery: Shower with CHG soap as directed.  Do not shave 48 hours prior to surgery.  Men may shave face and neck.  Do not wear lotions, powders, perfumes/colognes, or deodorant.  Wear Clean/Comfortable clothing the morning of surgery  Remember to brush your teeth WITH YOUR REGULAR TOOTHPASTE.             Do not wear jewelry, make up, or nail polish  Do not bring valuables to the hospital.             [facility] is not responsible for any belongings or valuables.    Please read over the following fact sheets that you were given.

## 2020-09-23 ENCOUNTER — Encounter (HOSPITAL_COMMUNITY): Payer: Self-pay

## 2020-09-23 ENCOUNTER — Ambulatory Visit (HOSPITAL_COMMUNITY)
Admission: RE | Admit: 2020-09-23 | Discharge: 2020-09-23 | Disposition: A | Discharge status: Home / Self Care | Payer: 59 | Source: Ambulatory Visit | Attending: Orthopedic Surgery | Admitting: Orthopedic Surgery

## 2020-09-23 ENCOUNTER — Encounter (HOSPITAL_COMMUNITY)
Admission: RE | Admit: 2020-09-23 | Discharge: 2020-09-23 | Disposition: A | Discharge status: Home / Self Care | Payer: 59 | Source: Ambulatory Visit | Attending: Orthopedic Surgery | Admitting: Orthopedic Surgery

## 2020-09-23 ENCOUNTER — Other Ambulatory Visit: Payer: Self-pay

## 2020-09-23 ENCOUNTER — Other Ambulatory Visit (HOSPITAL_COMMUNITY)
Admission: RE | Admit: 2020-09-23 | Discharge: 2020-09-23 | Disposition: A | Discharge status: Home / Self Care | Payer: 59 | Source: Ambulatory Visit | Attending: Orthopedic Surgery | Admitting: Orthopedic Surgery

## 2020-09-23 DIAGNOSIS — Z20822 Contact with and (suspected) exposure to covid-19: Secondary | ICD-10-CM | POA: Insufficient documentation

## 2020-09-23 DIAGNOSIS — Z01818 Encounter for other preprocedural examination: Secondary | ICD-10-CM | POA: Insufficient documentation

## 2020-09-23 DIAGNOSIS — Z01812 Encounter for preprocedural laboratory examination: Secondary | ICD-10-CM | POA: Insufficient documentation

## 2020-09-23 HISTORY — DX: Ocular hypertension, bilateral: H40.053

## 2020-09-23 HISTORY — DX: Noninfective gastroenteritis and colitis, unspecified: K52.9

## 2020-09-23 LAB — BASIC METABOLIC PANEL
Anion gap: 9 (ref 5–15)
BUN: 17 mg/dL (ref 8–23)
CO2: 26 mmol/L (ref 22–32)
Calcium: 10 mg/dL (ref 8.9–10.3)
Chloride: 103 mmol/L (ref 98–111)
Creatinine, Ser: 0.87 mg/dL (ref 0.44–1.00)
GFR, Estimated: >60 mL/min (ref >60)
Glucose, Bld: 96 mg/dL (ref 70–99)
Potassium: 4.2 mmol/L (ref 3.5–5.1)
Sodium: 138 mmol/L (ref 135–145)

## 2020-09-23 LAB — CBC
HCT: 45.3 % (ref 36.0–46.0)
Hemoglobin: 14.9 g/dL (ref 12.0–15.0)
MCH: 30.6 pg (ref 26.0–34.0)
MCHC: 32.9 g/dL (ref 30.0–36.0)
MCV: 93 fL (ref 80.0–100.0)
Platelets: 210 10*3/uL (ref 150–400)
RBC: 4.87 MIL/uL (ref 3.87–5.11)
RDW: 13.2 % (ref 11.5–15.5)
WBC: 7.7 10*3/uL (ref 4.0–10.5)
nRBC: 0 % (ref 0.0–0.2)

## 2020-09-23 LAB — URINALYSIS, ROUTINE W REFLEX MICROSCOPIC
Bilirubin Urine: NEGATIVE
Glucose, UA: NEGATIVE mg/dL
Hgb urine dipstick: NEGATIVE
Ketones, ur: NEGATIVE mg/dL
Leukocytes,Ua: NEGATIVE
Nitrite: NEGATIVE
Protein, ur: NEGATIVE mg/dL
Specific Gravity, Urine: 1.008 (ref 1.005–1.030)
pH: 7 (ref 5.0–8.0)

## 2020-09-23 LAB — TYPE AND SCREEN
ABO/RH(D): A POS
Antibody Screen: NEGATIVE

## 2020-09-23 LAB — SARS CORONAVIRUS 2 (TAT 6-24 HRS): SARS Coronavirus 2: NEGATIVE

## 2020-09-23 LAB — SURGICAL PCR SCREEN
MRSA, PCR: NEGATIVE
Staphylococcus aureus: NEGATIVE

## 2020-09-23 LAB — PROTIME-INR
INR: 1 (ref 0.8–1.2)
Prothrombin Time: 12.5 seconds (ref 11.4–15.2)

## 2020-09-23 LAB — GLUCOSE, CAPILLARY: Glucose-Capillary: 78 mg/dL (ref 70–99)

## 2020-09-23 LAB — APTT: aPTT: 28 seconds (ref 24–36)

## 2020-09-23 NOTE — Progress Notes (Signed)
PCP - Dr. Lorene Dy Cardiologist - Patient denies  PPM/ICD - n/a Device Orders -  Rep Notified -   Chest x-ray - 09/23/20 EKG - n/a Stress Test -  About 20 years ago, stress induced, no follow up ECHO - patient denies Cardiac Cath - patient denies  Sleep Study - patient denies CPAP -   Fasting Blood Sugar - n/a Checks Blood Sugar _____ times a day  Blood Thinner Instructions: Aspirin Instructions: last dose 09/16/20  ERAS Protcol - clears until 05:30am DOS PRE-SURGERY Ensure or G2-   COVID TEST- after PAT appointment   Anesthesia review: yes, per Dr. Rolena Infante  Patient denies shortness of breath, fever, cough and chest pain at PAT appointment   All instructions explained to the patient, with a verbal understanding of the material. Patient agrees to go over the instructions while at home for a better understanding. Patient also instructed to self quarantine after being tested for COVID-19. The opportunity to ask questions was provided.

## 2020-09-24 ENCOUNTER — Ambulatory Visit: Payer: Self-pay | Admitting: Orthopedic Surgery

## 2020-09-24 NOTE — Anesthesia Preprocedure Evaluation (Addendum)
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    GI/Hepatic Neg liver ROS, GERD  Medicated,  Endo/Other  diabetesHypothyroidism   Renal/GU negative Renal ROS     Musculoskeletal  (+) Arthritis ,   Abdominal   Peds  Hematology negative hematology ROS (+)   Anesthesia Other Findings   Reproductive/Obstetrics                            Anesthesia Physical Anesthesia Plan  ASA: II  Anesthesia Plan: General   Post-op Pain Management:  Regional for Post-op pain   Induction: Intravenous  PONV Risk Score and Plan: 4 or greater and Midazolam, Dexamethasone, Ondansetron, Scopolamine patch - Pre-op and TIVA  Airway Management Planned: Oral ETT  Additional Equipment:   Intra-op Plan:   Post-operative Plan: Extubation in OR  Informed Consent: I have reviewed the patients History and Physical, chart, labs and discussed the procedure including the risks, benefits and alternatives for the proposed anesthesia with the patient or authorized representative who has indicated his/her understanding and acceptance.     Dental advisory given  Plan Discussed with: CRNA  Anesthesia Plan Comments: (2nd large bore PIV, TAP block pre-op)     Anesthesia Quick Evaluation

## 2020-09-25 ENCOUNTER — Inpatient Hospital Stay (HOSPITAL_COMMUNITY)
Admission: RE | Disposition: A | Discharge status: Home / Self Care | Payer: Self-pay | Source: Home / Self Care | Attending: Orthopedic Surgery

## 2020-09-25 ENCOUNTER — Other Ambulatory Visit: Payer: Self-pay

## 2020-09-25 ENCOUNTER — Inpatient Hospital Stay (HOSPITAL_COMMUNITY): Payer: 59 | Admitting: Physician Assistant

## 2020-09-25 ENCOUNTER — Inpatient Hospital Stay (HOSPITAL_COMMUNITY): Payer: 59

## 2020-09-25 ENCOUNTER — Inpatient Hospital Stay (HOSPITAL_COMMUNITY): Payer: 59 | Admitting: Anesthesiology

## 2020-09-25 ENCOUNTER — Encounter (HOSPITAL_COMMUNITY): Payer: Self-pay | Admitting: Orthopedic Surgery

## 2020-09-25 ENCOUNTER — Inpatient Hospital Stay (HOSPITAL_COMMUNITY)
Admission: RE | Admit: 2020-09-25 | Discharge: 2020-09-26 | DRG: 455 | Disposition: A | Discharge status: Home / Self Care | Payer: 59 | Attending: Orthopedic Surgery | Admitting: Orthopedic Surgery

## 2020-09-25 ENCOUNTER — Encounter (HOSPITAL_COMMUNITY): Payer: 59

## 2020-09-25 DIAGNOSIS — H40053 Ocular hypertension, bilateral: Secondary | ICD-10-CM | POA: Diagnosis present

## 2020-09-25 DIAGNOSIS — Z20822 Contact with and (suspected) exposure to covid-19: Secondary | ICD-10-CM | POA: Diagnosis present

## 2020-09-25 DIAGNOSIS — Z96653 Presence of artificial knee joint, bilateral: Secondary | ICD-10-CM | POA: Diagnosis present

## 2020-09-25 DIAGNOSIS — Z79890 Hormone replacement therapy (postmenopausal): Secondary | ICD-10-CM | POA: Diagnosis not present

## 2020-09-25 DIAGNOSIS — R7303 Prediabetes: Secondary | ICD-10-CM | POA: Diagnosis present

## 2020-09-25 DIAGNOSIS — Z79899 Other long term (current) drug therapy: Secondary | ICD-10-CM

## 2020-09-25 DIAGNOSIS — E039 Hypothyroidism, unspecified: Secondary | ICD-10-CM | POA: Diagnosis present

## 2020-09-25 DIAGNOSIS — Z885 Allergy status to narcotic agent status: Secondary | ICD-10-CM | POA: Diagnosis not present

## 2020-09-25 DIAGNOSIS — K219 Gastro-esophageal reflux disease without esophagitis: Secondary | ICD-10-CM | POA: Diagnosis present

## 2020-09-25 DIAGNOSIS — M5116 Intervertebral disc disorders with radiculopathy, lumbar region: Secondary | ICD-10-CM | POA: Diagnosis present

## 2020-09-25 DIAGNOSIS — Z981 Arthrodesis status: Secondary | ICD-10-CM

## 2020-09-25 DIAGNOSIS — Z833 Family history of diabetes mellitus: Secondary | ICD-10-CM | POA: Diagnosis not present

## 2020-09-25 DIAGNOSIS — M4316 Spondylolisthesis, lumbar region: Secondary | ICD-10-CM | POA: Diagnosis present

## 2020-09-25 DIAGNOSIS — Z7982 Long term (current) use of aspirin: Secondary | ICD-10-CM

## 2020-09-25 DIAGNOSIS — Z888 Allergy status to other drugs, medicaments and biological substances status: Secondary | ICD-10-CM

## 2020-09-25 DIAGNOSIS — G2581 Restless legs syndrome: Secondary | ICD-10-CM | POA: Diagnosis present

## 2020-09-25 DIAGNOSIS — Z886 Allergy status to analgesic agent status: Secondary | ICD-10-CM

## 2020-09-25 DIAGNOSIS — Z9889 Other specified postprocedural states: Secondary | ICD-10-CM | POA: Diagnosis not present

## 2020-09-25 DIAGNOSIS — E7800 Pure hypercholesterolemia, unspecified: Secondary | ICD-10-CM | POA: Diagnosis present

## 2020-09-25 DIAGNOSIS — Z419 Encounter for procedure for purposes other than remedying health state, unspecified: Secondary | ICD-10-CM

## 2020-09-25 HISTORY — PX: ABDOMINAL EXPOSURE: SHX5708

## 2020-09-25 HISTORY — PX: ANTERIOR AND POSTERIOR SPINAL FUSION: SHX2259

## 2020-09-25 LAB — CREATININE, SERUM
Creatinine, Ser: 0.87 mg/dL (ref 0.44–1.00)
GFR, Estimated: >60 mL/min (ref >60)

## 2020-09-25 LAB — ABO/RH: ABO/RH(D): A POS

## 2020-09-25 LAB — CBC
HCT: 40.2 % (ref 36.0–46.0)
Hemoglobin: 13.4 g/dL (ref 12.0–15.0)
MCH: 30.9 pg (ref 26.0–34.0)
MCHC: 33.3 g/dL (ref 30.0–36.0)
MCV: 92.6 fL (ref 80.0–100.0)
Platelets: 192 10*3/uL (ref 150–400)
RBC: 4.34 MIL/uL (ref 3.87–5.11)
RDW: 13.2 % (ref 11.5–15.5)
WBC: 14.2 10*3/uL — ABNORMAL HIGH (ref 4.0–10.5)
nRBC: 0 % (ref 0.0–0.2)

## 2020-09-25 LAB — GLUCOSE, CAPILLARY: Glucose-Capillary: 95 mg/dL (ref 70–99)

## 2020-09-25 SURGERY — ANTERIOR AND POSTERIOR SPINAL FUSION
Anesthesia: General

## 2020-09-25 MED ORDER — ENOXAPARIN SODIUM 40 MG/0.4ML ~~LOC~~ SOLN: 40.0000 mg | SUBCUTANEOUS | 0 refills | Status: DC

## 2020-09-25 MED ORDER — METHOCARBAMOL 500 MG PO TABS: 500.0000 mg | ORAL_TABLET | Freq: Three times a day (TID) | ORAL | 0 refills | Status: AC | PRN

## 2020-09-25 MED ORDER — DIPHENHYDRAMINE HCL 50 MG/ML IJ SOLN
INTRAMUSCULAR | Status: AC
  Filled 2020-09-25: qty 1

## 2020-09-25 MED ORDER — ONDANSETRON HCL 4 MG/2ML IJ SOLN
4.0000 mg | Freq: Four times a day (QID) | INTRAMUSCULAR | Status: DC | PRN
  Administered 2020-09-25: 4 mg via INTRAVENOUS

## 2020-09-25 MED ORDER — ORAL CARE MOUTH RINSE: 15.0000 mL | Freq: Once | OROMUCOSAL | Status: AC

## 2020-09-25 MED ORDER — SCOPOLAMINE 1 MG/3DAYS TD PT72
1.0000 | MEDICATED_PATCH | Freq: Once | TRANSDERMAL | Status: DC
  Administered 2020-09-25: 1.5 mg via TRANSDERMAL
  Filled 2020-09-25: qty 1

## 2020-09-25 MED ORDER — DEXAMETHASONE SODIUM PHOSPHATE 10 MG/ML IJ SOLN
INTRAMUSCULAR | Status: DC | PRN
  Administered 2020-09-25: 10 mg via INTRAVENOUS

## 2020-09-25 MED ORDER — ACETAMINOPHEN 325 MG PO TABS: 650.0000 mg | ORAL_TABLET | ORAL | Status: DC | PRN

## 2020-09-25 MED ORDER — SODIUM CHLORIDE 0.9% FLUSH: 3.0000 mL | Freq: Two times a day (BID) | INTRAVENOUS | Status: DC

## 2020-09-25 MED ORDER — LACTATED RINGERS IV SOLN: INTRAVENOUS | Status: DC

## 2020-09-25 MED ORDER — CHLORHEXIDINE GLUCONATE 4 % EX LIQD: 60.0000 mL | Freq: Once | CUTANEOUS | Status: DC

## 2020-09-25 MED ORDER — ACETAMINOPHEN 500 MG PO TABS
1000.0000 mg | ORAL_TABLET | Freq: Once | ORAL | Status: AC
  Administered 2020-09-25: 1000 mg via ORAL
  Filled 2020-09-25: qty 2

## 2020-09-25 MED ORDER — THROMBIN 20000 UNITS EX SOLR
CUTANEOUS | Status: DC | PRN
  Administered 2020-09-25: 20000 [IU] via TOPICAL

## 2020-09-25 MED ORDER — ACETAMINOPHEN 650 MG RE SUPP: 650.0000 mg | RECTAL | Status: DC | PRN

## 2020-09-25 MED ORDER — THROMBIN 20000 UNITS EX KIT
PACK | CUTANEOUS | Status: AC
  Filled 2020-09-25: qty 1

## 2020-09-25 MED ORDER — OXYCODONE HCL 5 MG PO TABS
5.0000 mg | ORAL_TABLET | ORAL | Status: DC | PRN
  Administered 2020-09-25: 5 mg via ORAL
  Filled 2020-09-25: qty 1

## 2020-09-25 MED ORDER — ENOXAPARIN SODIUM 40 MG/0.4ML ~~LOC~~ SOLN
40.0000 mg | SUBCUTANEOUS | Status: DC
  Administered 2020-09-26: 40 mg via SUBCUTANEOUS
  Filled 2020-09-25: qty 0.4

## 2020-09-25 MED ORDER — OXYCODONE HCL 5 MG PO TABS
ORAL_TABLET | ORAL | Status: AC
  Administered 2020-09-25: 5 mg via ORAL
  Filled 2020-09-25: qty 1

## 2020-09-25 MED ORDER — PHENOL 1.4 % MT LIQD: 1.0000 | OROMUCOSAL | Status: DC | PRN

## 2020-09-25 MED ORDER — CLONAZEPAM 0.5 MG PO TABS
1.0000 mg | ORAL_TABLET | Freq: Every evening | ORAL | Status: DC | PRN
Start: 2020-09-25 — End: 2020-09-26

## 2020-09-25 MED ORDER — SUCCINYLCHOLINE CHLORIDE 200 MG/10ML IV SOSY
PREFILLED_SYRINGE | INTRAVENOUS | Status: AC
  Filled 2020-09-25: qty 10

## 2020-09-25 MED ORDER — HYDROMORPHONE HCL 1 MG/ML IJ SOLN: 0.5000 mg | INTRAMUSCULAR | Status: DC | PRN

## 2020-09-25 MED ORDER — 0.9 % SODIUM CHLORIDE (POUR BTL) OPTIME
TOPICAL | Status: DC | PRN
  Administered 2020-09-25: 2000 mL

## 2020-09-25 MED ORDER — POLYETHYLENE GLYCOL 3350 17 G PO PACK
17.0000 g | PACK | Freq: Every day | ORAL | Status: DC | PRN
  Administered 2020-09-26: 17 g via ORAL
  Filled 2020-09-25: qty 1

## 2020-09-25 MED ORDER — SUFENTANIL CITRATE 250 MCG/5ML IV SOLN
0.2500 ug/kg/h | INTRAVENOUS | Status: AC
  Administered 2020-09-25: .25 ug/kg/h via INTRAVENOUS
  Filled 2020-09-25: qty 5

## 2020-09-25 MED ORDER — CEFAZOLIN SODIUM-DEXTROSE 1-4 GM/50ML-% IV SOLN
1.0000 g | Freq: Three times a day (TID) | INTRAVENOUS | Status: AC
  Administered 2020-09-25 – 2020-09-26 (×2): 1 g via INTRAVENOUS
  Filled 2020-09-25 (×2): qty 50

## 2020-09-25 MED ORDER — ROCURONIUM BROMIDE 10 MG/ML (PF) SYRINGE
PREFILLED_SYRINGE | INTRAVENOUS | Status: AC
  Filled 2020-09-25: qty 10

## 2020-09-25 MED ORDER — DOCUSATE SODIUM 100 MG PO CAPS
100.0000 mg | ORAL_CAPSULE | Freq: Two times a day (BID) | ORAL | Status: DC
  Administered 2020-09-25 – 2020-09-26 (×2): 100 mg via ORAL
  Filled 2020-09-25 (×2): qty 1

## 2020-09-25 MED ORDER — PROPOFOL 500 MG/50ML IV EMUL
INTRAVENOUS | Status: DC | PRN
  Administered 2020-09-25: 150 ug/kg/min via INTRAVENOUS

## 2020-09-25 MED ORDER — BUPIVACAINE-EPINEPHRINE (PF) 0.25% -1:200000 IJ SOLN
INTRAMUSCULAR | Status: AC
  Filled 2020-09-25: qty 30

## 2020-09-25 MED ORDER — METHOCARBAMOL 500 MG PO TABS
ORAL_TABLET | ORAL | Status: AC
  Filled 2020-09-25: qty 1

## 2020-09-25 MED ORDER — HEMOSTATIC AGENTS (NO CHARGE) OPTIME
TOPICAL | Status: DC | PRN
  Administered 2020-09-25 (×4): 1 via TOPICAL

## 2020-09-25 MED ORDER — PROPOFOL 1000 MG/100ML IV EMUL
INTRAVENOUS | Status: AC
  Filled 2020-09-25: qty 200

## 2020-09-25 MED ORDER — ONDANSETRON HCL 4 MG PO TABS: 4.0000 mg | ORAL_TABLET | Freq: Three times a day (TID) | ORAL | 0 refills | Status: DC | PRN

## 2020-09-25 MED ORDER — ONDANSETRON HCL 4 MG PO TABS: 4.0000 mg | ORAL_TABLET | Freq: Four times a day (QID) | ORAL | Status: DC | PRN

## 2020-09-25 MED ORDER — PROPOFOL 1000 MG/100ML IV EMUL
INTRAVENOUS | Status: AC
  Filled 2020-09-25: qty 100

## 2020-09-25 MED ORDER — FENTANYL CITRATE (PF) 100 MCG/2ML IJ SOLN
INTRAMUSCULAR | Status: AC
  Filled 2020-09-25: qty 2

## 2020-09-25 MED ORDER — MIDAZOLAM HCL 5 MG/5ML IJ SOLN
INTRAMUSCULAR | Status: DC | PRN
  Administered 2020-09-25: 2 mg via INTRAVENOUS

## 2020-09-25 MED ORDER — GABAPENTIN 300 MG PO CAPS
300.0000 mg | ORAL_CAPSULE | Freq: Three times a day (TID) | ORAL | Status: DC
  Filled 2020-09-25 (×3): qty 1

## 2020-09-25 MED ORDER — BUPIVACAINE HCL (PF) 0.5 % IJ SOLN
INTRAMUSCULAR | Status: DC | PRN
  Administered 2020-09-25: 20 mL via PERINEURAL

## 2020-09-25 MED ORDER — ONDANSETRON HCL 4 MG/2ML IJ SOLN: 4.0000 mg | Freq: Four times a day (QID) | INTRAMUSCULAR | Status: DC | PRN

## 2020-09-25 MED ORDER — FENTANYL CITRATE (PF) 100 MCG/2ML IJ SOLN
25.0000 ug | INTRAMUSCULAR | Status: DC | PRN
  Administered 2020-09-25 (×2): 50 ug via INTRAVENOUS

## 2020-09-25 MED ORDER — OXYCODONE HCL 5 MG PO TABS
10.0000 mg | ORAL_TABLET | ORAL | Status: DC | PRN
  Administered 2020-09-25 – 2020-09-26 (×5): 10 mg via ORAL
  Filled 2020-09-25 (×5): qty 2

## 2020-09-25 MED ORDER — SUGAMMADEX SODIUM 200 MG/2ML IV SOLN
INTRAVENOUS | Status: DC | PRN
  Administered 2020-09-25: 200 mg via INTRAVENOUS

## 2020-09-25 MED ORDER — ONDANSETRON HCL 4 MG/2ML IJ SOLN
INTRAMUSCULAR | Status: AC
  Filled 2020-09-25: qty 2

## 2020-09-25 MED ORDER — CEFAZOLIN SODIUM-DEXTROSE 2-4 GM/100ML-% IV SOLN
2.0000 g | INTRAVENOUS | Status: AC
  Administered 2020-09-25: 2 g via INTRAVENOUS
  Filled 2020-09-25: qty 100

## 2020-09-25 MED ORDER — ONDANSETRON HCL 4 MG PO TABS
4.0000 mg | ORAL_TABLET | ORAL | Status: DC | PRN
  Administered 2020-09-25 – 2020-09-26 (×6): 4 mg via ORAL
  Filled 2020-09-25 (×6): qty 1

## 2020-09-25 MED ORDER — FENTANYL CITRATE (PF) 250 MCG/5ML IJ SOLN
INTRAMUSCULAR | Status: AC
  Filled 2020-09-25: qty 5

## 2020-09-25 MED ORDER — MENTHOL 3 MG MT LOZG: 1.0000 | LOZENGE | OROMUCOSAL | Status: DC | PRN

## 2020-09-25 MED ORDER — METHOCARBAMOL 1000 MG/10ML IJ SOLN
500.0000 mg | Freq: Four times a day (QID) | INTRAVENOUS | Status: DC | PRN
  Filled 2020-09-25: qty 5

## 2020-09-25 MED ORDER — OXYCODONE-ACETAMINOPHEN 10-325 MG PO TABS: 1.0000 | ORAL_TABLET | Freq: Four times a day (QID) | ORAL | 0 refills | Status: AC | PRN

## 2020-09-25 MED ORDER — LIDOCAINE 2% (20 MG/ML) 5 ML SYRINGE
INTRAMUSCULAR | Status: DC | PRN
  Administered 2020-09-25: 60 mg via INTRAVENOUS

## 2020-09-25 MED ORDER — METHOCARBAMOL 500 MG PO TABS
500.0000 mg | ORAL_TABLET | Freq: Four times a day (QID) | ORAL | Status: DC | PRN
  Administered 2020-09-25 – 2020-09-26 (×3): 500 mg via ORAL
  Filled 2020-09-25 (×2): qty 1

## 2020-09-25 MED ORDER — FENTANYL CITRATE (PF) 100 MCG/2ML IJ SOLN
INTRAMUSCULAR | Status: DC | PRN
  Administered 2020-09-25: 100 ug via INTRAVENOUS

## 2020-09-25 MED ORDER — DEXAMETHASONE SODIUM PHOSPHATE 10 MG/ML IJ SOLN
INTRAMUSCULAR | Status: AC
  Filled 2020-09-25: qty 1

## 2020-09-25 MED ORDER — MIDAZOLAM HCL 2 MG/2ML IJ SOLN
INTRAMUSCULAR | Status: AC
  Filled 2020-09-25: qty 2

## 2020-09-25 MED ORDER — HYDROMORPHONE HCL 1 MG/ML IJ SOLN
INTRAMUSCULAR | Status: AC
  Administered 2020-09-25: 0.5 mg via INTRAVENOUS
  Filled 2020-09-25: qty 1

## 2020-09-25 MED ORDER — CHLORHEXIDINE GLUCONATE 0.12 % MT SOLN
15.0000 mL | Freq: Once | OROMUCOSAL | Status: AC
  Administered 2020-09-25: 15 mL via OROMUCOSAL
  Filled 2020-09-25: qty 15

## 2020-09-25 MED ORDER — ROCURONIUM BROMIDE 10 MG/ML (PF) SYRINGE
PREFILLED_SYRINGE | INTRAVENOUS | Status: DC | PRN
  Administered 2020-09-25: 50 mg via INTRAVENOUS
  Administered 2020-09-25: 10 mg via INTRAVENOUS

## 2020-09-25 MED ORDER — SODIUM CHLORIDE 0.9% FLUSH: 3.0000 mL | INTRAVENOUS | Status: DC | PRN

## 2020-09-25 MED ORDER — PROPOFOL 10 MG/ML IV BOLUS
INTRAVENOUS | Status: DC | PRN
  Administered 2020-09-25: 150 mg via INTRAVENOUS

## 2020-09-25 MED ORDER — LIDOCAINE 2% (20 MG/ML) 5 ML SYRINGE
INTRAMUSCULAR | Status: AC
  Filled 2020-09-25: qty 5

## 2020-09-25 MED ORDER — BUPIVACAINE-EPINEPHRINE 0.25% -1:200000 IJ SOLN
INTRAMUSCULAR | Status: DC | PRN
  Administered 2020-09-25: 20 mL

## 2020-09-25 MED ORDER — BUPIVACAINE LIPOSOME 1.3 % IJ SUSP
INTRAMUSCULAR | Status: DC | PRN
  Administered 2020-09-25: 10 mL via PERINEURAL

## 2020-09-25 MED ORDER — LEVOTHYROXINE SODIUM 25 MCG PO TABS
50.0000 ug | ORAL_TABLET | Freq: Every day | ORAL | Status: DC
  Administered 2020-09-26: 50 ug via ORAL
  Filled 2020-09-25: qty 2

## 2020-09-25 MED ORDER — PROMETHAZINE HCL 25 MG/ML IJ SOLN: 6.2500 mg | INTRAMUSCULAR | Status: DC | PRN

## 2020-09-25 MED ORDER — LACTATED RINGERS IV SOLN: INTRAVENOUS | Status: DC | PRN

## 2020-09-25 MED ORDER — ONDANSETRON HCL 4 MG/2ML IJ SOLN
INTRAMUSCULAR | Status: DC | PRN
  Administered 2020-09-25: 4 mg via INTRAVENOUS

## 2020-09-25 MED ORDER — EZETIMIBE 10 MG PO TABS
10.0000 mg | ORAL_TABLET | Freq: Every day | ORAL | Status: DC
Start: 2020-09-25 — End: 2020-09-26
  Administered 2020-09-25 – 2020-09-26 (×2): 10 mg via ORAL
  Filled 2020-09-25 (×2): qty 1

## 2020-09-25 MED ORDER — FLEET ENEMA 7-19 GM/118ML RE ENEM: 1.0000 | ENEMA | Freq: Once | RECTAL | Status: DC | PRN

## 2020-09-25 MED ORDER — PHENYLEPHRINE HCL-NACL 10-0.9 MG/250ML-% IV SOLN
INTRAVENOUS | Status: DC | PRN
  Administered 2020-09-25: 20 ug/min via INTRAVENOUS

## 2020-09-25 MED ORDER — PROPOFOL 10 MG/ML IV BOLUS
INTRAVENOUS | Status: AC
  Filled 2020-09-25: qty 40

## 2020-09-25 SURGICAL SUPPLY — 109 items
APPLIER CLIP 11 MED OPEN (CLIP) ×6
BLADE CLIPPER SURG (BLADE) IMPLANT
BLADE SURG 10 STRL SS (BLADE) ×6 IMPLANT
CLIP APPLIE 11 MED OPEN (CLIP) ×2 IMPLANT
CLIP LIGATING EXTRA MED SLVR (CLIP) ×3 IMPLANT
CLIP LIGATING EXTRA SM BLUE (MISCELLANEOUS) ×3 IMPLANT
CLIP NEUROVISION LG (CLIP) ×6 IMPLANT
CLOSURE STERI-STRIP 1/2X4 (GAUZE/BANDAGES/DRESSINGS) ×2
CLOSURE WOUND 1/2 X4 (GAUZE/BANDAGES/DRESSINGS) ×2
CLSR STERI-STRIP ANTIMIC 1/2X4 (GAUZE/BANDAGES/DRESSINGS) ×4 IMPLANT
CORD BIPOLAR FORCEPS 12FT (ELECTRODE) ×3 IMPLANT
COVER SURGICAL LIGHT HANDLE (MISCELLANEOUS) ×3 IMPLANT
COVER WAND RF STERILE (DRAPES) ×6 IMPLANT
DERMABOND ADVANCED (GAUZE/BANDAGES/DRESSINGS) ×2
DERMABOND ADVANCED .7 DNX12 (GAUZE/BANDAGES/DRESSINGS) ×1 IMPLANT
DRAPE C-ARM 42X72 X-RAY (DRAPES) ×3 IMPLANT
DRAPE C-ARMOR (DRAPES) ×3 IMPLANT
DRAPE INCISE IOBAN 66X45 STRL (DRAPES) ×3 IMPLANT
DRAPE LAPAROTOMY T 102X78X121 (DRAPES) ×3 IMPLANT
DRAPE SURG 17X23 STRL (DRAPES) ×6 IMPLANT
DRAPE U-SHAPE 47X51 STRL (DRAPES) ×6 IMPLANT
DRSG AQUACEL AG ADV 3.5X 6 (GAUZE/BANDAGES/DRESSINGS) ×6 IMPLANT
DRSG OPSITE POSTOP 4X6 (GAUZE/BANDAGES/DRESSINGS) ×6 IMPLANT
DRSG OPSITE POSTOP 4X8 (GAUZE/BANDAGES/DRESSINGS) ×3 IMPLANT
DURAPREP 26ML APPLICATOR (WOUND CARE) ×6 IMPLANT
ELECT BLADE 4.0 EZ CLEAN MEGAD (MISCELLANEOUS) ×6
ELECT CAUTERY BLADE 6.4 (BLADE) ×3 IMPLANT
ELECT PENCIL ROCKER SW 15FT (MISCELLANEOUS) ×6 IMPLANT
ELECT REM PT RETURN 9FT ADLT (ELECTROSURGICAL) ×6
ELECTRODE BLDE 4.0 EZ CLN MEGD (MISCELLANEOUS) ×2 IMPLANT
ELECTRODE REM PT RTRN 9FT ADLT (ELECTROSURGICAL) ×2 IMPLANT
GLOVE BIO SURGEON STRL SZ 6.5 (GLOVE) ×4 IMPLANT
GLOVE BIO SURGEON STRL SZ7.5 (GLOVE) IMPLANT
GLOVE BIO SURGEONS STRL SZ 6.5 (GLOVE) ×2
GLOVE BIOGEL PI IND STRL 8.5 (GLOVE) ×2 IMPLANT
GLOVE BIOGEL PI INDICATOR 8.5 (GLOVE) ×4
GLOVE OPTIFIT SS 7.0 STRL BRWN (GLOVE) ×1
GLOVE OPTIFIT SS 7.5 STRL LX (GLOVE) ×2 IMPLANT
GLOVE SRG 8 PF TXTR STRL LF DI (GLOVE) IMPLANT
GLOVE SS BIOGEL STRL SZ 7.5 (GLOVE) ×1 IMPLANT
GLOVE SS BIOGEL STRL SZ 8.5 (GLOVE) ×2 IMPLANT
GLOVE SUPERSENSE BIOGEL SZ 7.5 (GLOVE) ×2
GLOVE SUPERSENSE BIOGEL SZ 8.5 (GLOVE) ×4
GLOVE SURG UNDER POLY LF SZ6.5 (GLOVE) ×6 IMPLANT
GLOVE SURG UNDER POLY LF SZ8 (GLOVE)
GOWN STRL REUS W/ TWL LRG LVL3 (GOWN DISPOSABLE) ×3 IMPLANT
GOWN STRL REUS W/TWL 2XL LVL3 (GOWN DISPOSABLE) ×6 IMPLANT
GOWN STRL REUS W/TWL LRG LVL3 (GOWN DISPOSABLE) ×9
GUIDEWIRE NITINOL BEVEL TIP (WIRE) ×12 IMPLANT
HEMOSTAT SURGICEL 2X14 (HEMOSTASIS) ×3 IMPLANT
INSERT FOGARTY 61MM (MISCELLANEOUS) IMPLANT
INSERT FOGARTY SM (MISCELLANEOUS) IMPLANT
KIT BASIN OR (CUSTOM PROCEDURE TRAY) ×3 IMPLANT
KIT POSITION SURG JACKSON T1 (MISCELLANEOUS) ×3 IMPLANT
KIT TURNOVER KIT B (KITS) ×3 IMPLANT
LOOP VESSEL MAXI BLUE (MISCELLANEOUS) IMPLANT
LOOP VESSEL MINI RED (MISCELLANEOUS) IMPLANT
MARKER PEN SURG W/LABELS BLK (STERILIZATION PRODUCTS) ×3 IMPLANT
MIX DBX 10CC 35% BONE (Bone Implant) ×3 IMPLANT
MODULE EMG NEEDLE SSEP NVM5 (NEEDLE) ×3 IMPLANT
MODULE NVM5 NEXT GEN EMG (NEEDLE) ×3 IMPLANT
NEEDLE HYPO 22GX1.5 SAFETY (NEEDLE) ×3 IMPLANT
NEEDLE I-PASS III (NEEDLE) ×3 IMPLANT
NEEDLE SPNL 18GX3.5 QUINCKE PK (NEEDLE) ×3 IMPLANT
NS IRRIG 1000ML POUR BTL (IV SOLUTION) ×3 IMPLANT
PACK LAMINECTOMY ORTHO (CUSTOM PROCEDURE TRAY) ×3 IMPLANT
PACK UNIVERSAL I (CUSTOM PROCEDURE TRAY) ×6 IMPLANT
PAD ARMBOARD 7.5X6 YLW CONV (MISCELLANEOUS) ×12 IMPLANT
PATTIES SURGICAL .5 X1 (DISPOSABLE) IMPLANT
PROBE BALL TIP NVM5 SNG USE (BALLOONS) ×3 IMPLANT
REDUCTION EXT RELINE MAS MOD (Neuro Prosthesis/Implant) ×3 IMPLANT
ROD RELINE MAS LORD 5.5X45MM (Rod) ×6 IMPLANT
SCREW LOCK RELINE 5.5 TULIP (Screw) ×12 IMPLANT
SCREW RELINE MAS POLY 6.5X40MM (Screw) ×9 IMPLANT
SCREW RELINE RED 6.5X45MM POLY (Screw) ×3 IMPLANT
SCREW SHANK MAS MOD 6.5X40MM (Screw) ×3 IMPLANT
SPACER OLIF 12X50 6D (Spacer) ×3 IMPLANT
SPONGE INTESTINAL PEANUT (DISPOSABLE) ×9 IMPLANT
SPONGE LAP 18X18 RF (DISPOSABLE) IMPLANT
SPONGE LAP 4X18 RFD (DISPOSABLE) IMPLANT
SPONGE SURGIFOAM ABS GEL 100 (HEMOSTASIS) ×6 IMPLANT
STAPLER VISISTAT 35W (STAPLE) IMPLANT
STRIP CLOSURE SKIN 1/2X4 (GAUZE/BANDAGES/DRESSINGS) ×4 IMPLANT
SURGIFLO W/THROMBIN 8M KIT (HEMOSTASIS) ×6 IMPLANT
SUT MNCRL AB 3-0 PS2 27 (SUTURE) ×6 IMPLANT
SUT PDS AB 1 CTX 36 (SUTURE) ×6 IMPLANT
SUT PROLENE 4 0 RB 1 (SUTURE)
SUT PROLENE 4-0 RB1 .5 CRCL 36 (SUTURE) IMPLANT
SUT PROLENE 5 0 CC1 (SUTURE) IMPLANT
SUT PROLENE 6 0 C 1 30 (SUTURE) ×3 IMPLANT
SUT PROLENE 6 0 CC (SUTURE) IMPLANT
SUT SILK 0 TIES 10X30 (SUTURE) ×3 IMPLANT
SUT SILK 2 0 TIES 10X30 (SUTURE) ×6 IMPLANT
SUT SILK 2 0SH CR/8 30 (SUTURE) IMPLANT
SUT SILK 3 0 TIES 10X30 (SUTURE) ×6 IMPLANT
SUT SILK 3 0SH CR/8 30 (SUTURE) IMPLANT
SUT VIC AB 1 CT1 18XCR BRD 8 (SUTURE) ×2 IMPLANT
SUT VIC AB 1 CT1 8-18 (SUTURE) ×6
SUT VIC AB 2-0 CT1 18 (SUTURE) ×9 IMPLANT
SUT VIC AB 3-0 SH 27 (SUTURE)
SUT VIC AB 3-0 SH 27XBRD (SUTURE) IMPLANT
SYR BULB IRRIG 60ML STRL (SYRINGE) ×3 IMPLANT
SYR CONTROL 10ML LL (SYRINGE) ×3 IMPLANT
TOWEL GREEN STERILE (TOWEL DISPOSABLE) ×9 IMPLANT
TOWEL GREEN STERILE FF (TOWEL DISPOSABLE) ×3 IMPLANT
TRAY FOLEY W/BAG SLVR 16FR (SET/KITS/TRAYS/PACK) ×3
TRAY FOLEY W/BAG SLVR 16FR ST (SET/KITS/TRAYS/PACK) ×1 IMPLANT
WATER STERILE IRR 1000ML POUR (IV SOLUTION) ×3 IMPLANT
YANKAUER SUCT BULB TIP NO VENT (SUCTIONS) ×3 IMPLANT

## 2020-09-25 NOTE — Anesthesia Procedure Notes (Signed)
Anesthesia Regional Block: TAP block   Pre-Anesthetic Checklist: ,, timeout performed, Correct Patient, Correct Site, Correct Laterality, Correct Procedure, Correct Position, site marked, Risks and benefits discussed,  Surgical consent,  Pre-op evaluation,  At surgeon's request and post-op pain management  Laterality: Left  Prep: chloraprep       Needles:  Injection technique: Single-shot  Needle Type: Echogenic Stimulator Needle     Needle Length: 10cm  Needle Gauge: 21     Additional Needles:   Procedures:,,,, ultrasound used (permanent image in chart),,,,  Narrative:  Start time: 09/25/2020 7:49 AM End time: 09/25/2020 7:59 AM Injection made incrementally with aspirations every 5 mL.  Performed by: Personally   Additional Notes: No pain on injection. No increased resistance to injection. Injection made in 5cc increments.  Good needle visualization.  Patient tolerated procedure well.

## 2020-09-25 NOTE — Op Note (Signed)
    OPERATIVE REPORT  DATE OF SURGERY: 09/25/2020  PATIENT: Lori [name], 60 y.o. female MRN: [id number]  DOB: Apr 07, 1961  PRE-OPERATIVE DIAGNOSIS: Degenerative disc disease  POST-OPERATIVE DIAGNOSIS:  Same  PROCEDURE: Oblique exposure for L4-5 anterior fusion  SURGEON:  Curt Jews, M.D.  Co-surgeon for the exposure Dr. Rolena Infante  PHYSICIAN ASSISTANT: Dr. Donzetta Matters.  Cleta Alberts, PA-C  The assistant was needed for exposure and to expedite the case  ANESTHESIA: General  EBL: per anesthesia record  Total I/O In: 3300 [I.V.:1300; Other:50; IV Piggyback:100] Out: 600 [Urine:350; Blood:250]  BLOOD ADMINISTERED: none  DRAINS: none  SPECIMEN: none  COUNTS CORRECT:  YES  PATIENT DISPOSITION:  PACU - hemodynamically stable  PROCEDURE DETAILS: Patient was taken operating placed supine position where general tracheal anesthesia was administered.  The patient was then placed in the left side up right-side-down position.  C-arm was brought onto the field to confirm the level of the L4-5 disc and then skin.  An oblique incision was made anterior to the anterior superior iliac spine and carried down through the anterior fascia with electrocautery.  The oblique muscles were spread and the retroperitoneal space was entered bluntly.  Blunt dissection was used to mobilize the intraperitoneal contents to the right.  Dissection was continued above the level of the psoas muscle.  The L4-5 disc was identified and blunt dissection was used with a Art therapist to give anterior oblique exposure.  The Thompson retractor was brought onto the field and the blades were positioned to retract the left psoas muscle to the left and the vessels to the right.  A spinal needle was placed in the L4-5 disc and C-arm was brought back onto the field to confirm that this was the appropriate level.  Further dissection was used to give adequate exposure for oblique fusion.  The remainder the procedure will be dictated as a  separate note by Dr. Lynnea Ferrier, M.D., [facility] 09/25/2020 1:36 PM  Note: Portions of this report may have been transcribed using voice recognition software.  Every effort has been made to ensure accuracy; however, inadvertent computerized transcription errors may still be present.

## 2020-09-25 NOTE — Anesthesia Postprocedure Evaluation (Signed)
Anesthesia Post Note  Patient: Lori [name]  Procedure(s) Performed: OBLIQUE LUMBAR INTERBODY FUSION (OLIF)  LUMBAR FOUR THROUGH FIVE, POSTERIOR SPINAL FUSION INTERBODY (N/A ) ABDOMINAL EXPOSURE (N/A )     Patient location during evaluation: PACU Anesthesia Type: General Level of consciousness: awake and alert Pain management: pain level controlled Vital Signs Assessment: post-procedure vital signs reviewed and stable Respiratory status: spontaneous breathing, nonlabored ventilation, respiratory function stable and patient connected to nasal cannula oxygen Cardiovascular status: blood pressure returned to baseline and stable Postop Assessment: no apparent nausea or vomiting Anesthetic complications: no   No complications documented.  Last Vitals:  Vitals:   09/25/20 1445 09/25/20 1515  BP: 105/62 110/62  Pulse: 65 62  Resp: 14 13  Temp:    SpO2: 98% 97%    Last Pain:  Vitals:   09/25/20 1515  TempSrc:   PainSc: 10-Worst pain ever                 Catalina Gravel

## 2020-09-25 NOTE — OR Nursing (Signed)
Incorrect Final Count.  Physician notified. X-ray taken. Dr. Enriqueta Shutter, Radiologist called with report of no foreign objects found in patient on x-ray.

## 2020-09-25 NOTE — H&P (Signed)
Addendum H&P  No change in clinical exam since her last appointment of 09/17/2020. Patient continues to have significant back buttock and neuropathic leg pain.  Despite appropriate conservative care her quality of life is continued to diminish.  Plan on moving forward with an oblique lumbar interbody fusion at L4-5 with supplemental posterior pedicle screw fixation.  I have gone over the risks, benefits, alternatives to surgery and all of her questions were addressed.  Patient has expressed a desire to move forward with surgery.

## 2020-09-25 NOTE — Anesthesia Procedure Notes (Signed)
Procedure Name: Intubation Date/Time: 09/25/2020 8:51 AM Performed by: Hoy Morn, CRNA Pre-anesthesia Checklist: Patient identified, Emergency Drugs available, Suction available and Patient being monitored Patient Re-evaluated:Patient Re-evaluated prior to induction Oxygen Delivery Method: Circle system utilized Preoxygenation: Pre-oxygenation with 100% oxygen Induction Type: IV induction Ventilation: Mask ventilation without difficulty Laryngoscope Size: Miller and 2 Grade View: Grade I Tube type: Oral Tube size: 7.0 mm Number of attempts: 1 Airway Equipment and Method: Stylet and Oral airway Placement Confirmation: ETT inserted through vocal cords under direct vision,  positive ETCO2 and breath sounds checked- equal and bilateral Secured at: 21 cm Tube secured with: Tape Dental Injury: Teeth and Oropharynx as per pre-operative assessment

## 2020-09-25 NOTE — OR Nursing (Signed)
No foreign objects in abdomen per radiologist.

## 2020-09-25 NOTE — Brief Op Note (Signed)
09/25/2020  12:07 PM  PATIENT:  Lori [name]  60 y.o. 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 Donnetta Hutching to do approach Tap block with exparel ABDOMINAL EXPOSURE (N/A)  SURGEON:  Surgeon(s) and Role: Panel 1:    Melina Schools, MD - Primary Panel 2:    * Early, Arvilla Meres, MD - Primary    * Waynetta Sandy, MD - Assisting  PHYSICIAN ASSISTANT: Cleta Alberts, PA  ASSISTANTS: Estill Bamberg Ward, PA  ANESTHESIA:   general  EBL:  250 mL   BLOOD ADMINISTERED:none  DRAINS: none   LOCAL MEDICATIONS USED:  MARCAINE     SPECIMEN:  No Specimen  DISPOSITION OF SPECIMEN:  N/A  COUNTS:  YES  TOURNIQUET:  * No tourniquets in log *  DICTATION: .Dragon Dictation  PLAN OF CARE: Admit to inpatient   PATIENT DISPOSITION:  PACU - hemodynamically stable.

## 2020-09-25 NOTE — Transfer of Care (Signed)
Immediate Anesthesia Transfer of Care Note  Patient: Lori [name]  Procedure(s) Performed: OBLIQUE LUMBAR INTERBODY FUSION (OLIF)  LUMBAR FOUR THROUGH FIVE, POSTERIOR SPINAL FUSION INTERBODY (N/A ) ABDOMINAL EXPOSURE (N/A )  Patient Location: PACU  Anesthesia Type:General  Level of Consciousness: awake and drowsy  Airway & Oxygen Therapy: Patient Spontanous Breathing and Patient connected to face mask oxygen  Post-op Assessment: Report given to RN and Post -op Vital signs reviewed and stable  Post vital signs: Reviewed and stable  Last Vitals:  Vitals Value Taken Time  BP 113/71 09/25/20 1314  Temp    Pulse 86 09/25/20 1316  Resp 13 09/25/20 1316  SpO2 100 % 09/25/20 1316  Vitals shown include unvalidated device data.  Last Pain:  Vitals:   09/25/20 0717  TempSrc:   PainSc: 2       Patients Stated Pain Goal: 3 (43/32/95 [id number])  Complications: No complications documented.

## 2020-09-25 NOTE — Discharge Instructions (Signed)
  Spinal Fusion, Adult, Care After This sheet gives you information about how to care for yourself after your procedure. Your doctor may also give you more specific instructions. If you have problems or questions, contact your doctor. Follow these instructions at home: Medicines Take over-the-counter and prescription medicines only as told by your doctor. These include any medicines for pain or blood-thinning medicines (anticoagulants). If you were prescribed an antibiotic medicine, take it as told by your doctor. Do not stop taking the antibiotic even if you start to feel better. Do not drive for 24 hours if you were given a medicine to help you relax (sedative) during your procedure. Do not drive or use heavy machinery while taking prescription pain medicine. If you have a brace: Wear the brace as told by your doctor. Take it off only as told by your doctor. Keep the brace clean. Managing pain, stiffness, and swelling If directed, put ice on the surgery area: If you have a removable brace, take it off as told by your doctor. Put ice in a plastic bag. Place a towel between your skin and the bag. Leave the ice on for 20 minutes, 2-3 times a day. Surgery cut care    Follow instructions from your doctor about how to take care of your cut from surgery (incision). Make sure you: Wash your hands with soap and water before you change your bandage (dressing). If you cannot use soap and water, use hand sanitizer. Change your bandage as told by your doctor. Leave stitches (sutures), skin glue, or skin tape (adhesive) strips in place. They may need to stay in place for 2 weeks or longer. If tape strips get loose and curl up, you may trim the loose edges. Do not remove tape strips completely unless your doctor says it is okay. Keep your cut from surgery clean and dry. Do not take baths, swim, or use a hot tub until your doctor says it is okay. Ask your doctor if you can take showers. You may only  be allowed to take sponge baths. Every day, check your cut from surgery and the area around it for: More redness, swelling, or pain. Fluid or blood. Warmth. Pus or a bad smell. If you have a drain tube, follow instructions from your doctor about caring for it. Do not take out the drain tube or any bandages unless your doctor says it is okay. Physical activity Rest and protect your back as much as possible. Follow instructions from your doctor about how to move. Use good posture to help your spine heal. Do not lift anything that is heavier than 8 lb (3.6 kg), or the limit that you are told, until your doctor says that it is safe. Do not twist or bend at the waist until your doctor says it is okay. It is best if you: Do not make pushing and pulling motions. Do not sit or lie down in the same position for a long time. Do not raise your hands or arms above your head. Return to your normal activities as told by your doctor. Ask your doctor what activities are safe for you. Rest and protect your back as much as you can. Do not start to exercise until your doctor says it is okay. Ask your doctor what kinds of exercise you can do to make your back stronger. Ok to shower in 5 days.  Do not take a bath or submerge the wound General instructions To prevent blood clots and lessen swelling   in your legs: Wear compression stockings as told. Walk one or more times every few hours as told by your doctor. Do not use any products that contain nicotine or tobacco, such as cigarettes and e-cigarettes. These can delay bone healing. If you need help quitting, ask your doctor. To prevent or treat constipation while you are taking prescription pain medicine, your doctor may suggest that you: Drink enough fluid to keep your pee (urine) pale yellow. Take over-the-counter or prescription medicines. Eat foods that are high in fiber. These include fresh fruits and vegetables, whole grains, and beans. Limit foods that  are high in fat and processed sugars, such as fried and sweet foods. Keep all follow-up visits as told by your doctor. This is important. Contact a doctor if: Your pain gets worse. Your medicine does not help your pain. Your legs or feet get painful or swollen. Your cut from surgery is more red, swollen, or painful. Your cut from surgery feels warm to the touch. You have: Fluid or blood coming from your cut from surgery. Pus or a bad smell coming from your cut from surgery. A fever. Weakness or loss of feeling (numbness) in your legs that is new or getting worse. Trouble controlling when you pee (urinate) or poop (have a bowel movement). You feel sick to your stomach (nauseous). You throw up (vomit). Get help right away if: Your pain is very bad. You have chest pain. You have trouble breathing. You start to have a cough. These symptoms may be an emergency. Do not wait to see if the symptoms will go away. Get medical help right away. Call your local emergency services (911 in the [place] Do not drive yourself to the hospital. Summary After the procedure, it is common to have pain in your back and pain by your surgery cut(s). Icing and pain medicines may help to control the pain. Follow directions from your doctor. Rest and protect your back as much as possible. Do not twist or bend at the waist. Get up and walk one or more times every few hours as told by your doctor. This information is not intended to replace advice given to you by your health care provider. Make sure you discuss any questions you have with your health care provider.  -signs and symptoms of a blood clot such as chest pain; shortness of breath; pain, swelling, or warmth in the leg -signs and symptoms of a stroke such as changes in vision; confusion; trouble speaking or understanding; severe headaches; sudden numbness or weakness of the face, arm or leg; trouble walking; dizziness; loss of coordination Side effects that  usually do not require medical attention (report to your doctor or health care professional if they continue or are bothersome): -hair loss -pain, redness, or irritation at site where injected This list may not describe all possible side effects. Call your doctor for medical advice about side effects. You may report side effects to FDA at 1-800-FDA-1088. Where should I keep my medicine? Keep out of the reach of children. Store at room temperature between 15 and 30 degrees C (59 and 86 degrees F). Do not freeze. If your injections have been specially prepared, you may need to store them in the refrigerator. Ask your pharmacist. Throw away any unused medicine after the expiration date. NOTE: This sheet is a summary. It may not cover all possible information. If you have questions about this medicine, talk to your doctor, pharmacist, or health care provider.       What is this medicine? ENOXAPARIN (ee nox a PA rin) is used after knee, hip, or abdominal surgeries to prevent blood clotting. It is also used to treat existing blood clots in the lungs or in the veins. This medicine may be used for other purposes; ask your health care provider or pharmacist if you have questions. COMMON BRAND NAME(S): Lovenox What should I tell my health care provider before I take this medicine? They need to know if you have any of these conditions: bleeding disorders, hemorrhage, or hemophilia infection of the heart or heart valves kidney or liver disease previous stroke prosthetic heart valve recent surgery or delivery of a baby ulcer in the stomach or intestine, diverticulitis, or other bowel disease an unusual or allergic reaction to enoxaparin, heparin, pork or pork products, other medicines, foods, dyes, or preservatives pregnant or trying to get pregnant breast-feeding How should I use this medicine? This medicine is for injection under the skin. It is usually given by a health-care  professional. You or a family member may be trained on how to give the injections. If you are to give yourself injections, make sure you understand how to use the syringe, measure the dose if necessary, and give the injection. To avoid bruising, do not rub the site where this medicine has been injected. Do not take your medicine more often than directed. Do not stop taking except on the advice of your doctor or health care professional. Make sure you receive a puncture-resistant container to dispose of the needles and syringes once you have finished with them. Do not reuse these items. Return the container to your doctor or health care professional for proper disposal. Talk to your pediatrician regarding the use of this medicine in children. Special care may be needed. Overdosage: If you think you have taken too much of this medicine contact a poison control center or emergency room at once. NOTE: This medicine is only for you. Do not share this medicine with others. What if I miss a dose? If you miss a dose, take it as soon as you can. If it is almost time for your next dose, take only that dose. Do not take double or extra doses. What may interact with this medicine? aspirin and aspirin-like medicines certain medicines that treat or prevent blood clots dipyridamole NSAIDs, medicines for pain and inflammation, like ibuprofen or naproxen This list may not describe all possible interactions. Give your health care provider a list of all the medicines, herbs, non-prescription drugs, or dietary supplements you use. Also tell them if you smoke, drink alcohol, or use illegal drugs. Some items may interact with your medicine. What should I watch for while using this medicine? Visit your healthcare professional for regular checks on your progress. You may need blood work done while you are taking this medicine. Your condition will be monitored carefully while you are receiving this medicine. It is important  not to miss any appointments. If you are going to need surgery or other procedure, tell your healthcare professional that you are using this medicine. Using this medicine for a long time may weaken your bones and increase the risk of bone fractures. Avoid sports and activities that might cause injury while you are using this medicine. Severe falls or injuries can cause unseen bleeding. Be careful when using sharp tools or knives. Consider using an electric razor. Take special care brushing or flossing your teeth. Report any injuries, bruising, or red spots on the skin to your healthcare professional.   Wear a medical ID bracelet or chain. Carry a card that describes your disease and details of your medicine and dosage times. What side effects may I notice from receiving this medicine? Side effects that you should report to your doctor or health care professional as soon as possible: allergic reactions like skin rash, itching or hives, swelling of the face, lips, or tongue bone pain signs and symptoms of bleeding such as bloody or black, tarry stools; red or dark-brown urine; spitting up blood or brown material that looks like coffee grounds; red spots on the skin; unusual bruising or bleeding from the eye, gums, or nose signs and symptoms of a blood clot such as chest pain; shortness of breath; pain, swelling, or warmth in the leg signs and symptoms of a stroke such as changes in vision; confusion; trouble speaking or understanding; severe headaches; sudden numbness or weakness of the face, arm or leg; trouble walking; dizziness; loss of coordination Side effects that usually do not require medical attention (report to your doctor or health care professional if they continue or are bothersome): hair loss pain, redness, or irritation at site where injected This list may not describe all possible side effects. Call your doctor for medical advice about side effects. You may report side effects to FDA at  1-800-FDA-1088. Where should I keep my medicine? Keep out of the reach of children. Store at room temperature between 15 and 30 degrees C (59 and 86 degrees F). Do not freeze. If your injections have been specially prepared, you may need to store them in the refrigerator. Ask your pharmacist. Throw away any unused medicine after the expiration date. NOTE: This sheet is a summary. It may not cover all possible information. If you have questions about this medicine, talk to your doctor, pharmacist, or health care provider. 

## 2020-09-25 NOTE — Op Note (Addendum)
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  Oblique lumbar interbody fusion L4-5 2.  Posterior spinal fusion instrumentation L4-5  SURGEON:  Melina Schools, MD  Vascular surgeon for anterior oblique approach: Dr. Sherren Mocha Early  PHYSICIAN ASSISTANT: Cleta Alberts, PA  ANESTHESIA:   General  EBL: 250 ml   Implants: Medtronic peek intervertebral cage (pivox): 12 x 50 x 6 degree lordosis.  NuVasive MIS pedicle screws: 6.5 x 40 mm length  Neuro monitoring: No adverse free running EMG and SSEP activity throughout the case.  All 4 pedicle screws were directly stimulated and there was no adverse activity greater than 40 mA.  Allograft: DBX mix  BRIEF HISTORY: Lori [name] is a 60 y.o. female who has had significant back buttock and neuropathic leg pain for some time now.  Imaging studies demonstrated degenerative spondylolisthesis with loss of normal disc space height and stenosis.  As result of the failure to improve with conservative management we elected to move forward with surgery.  All appropriate risks, effects, alternatives were discussed with the patient and consent was obtained  PROCEDURE DETAILS: Patient was brought into the operating room. After successful induction of general anesthesia and endotracheal intubation a Time Out was done. This confirmed all pertinent important data.  The patient was properly positioned in the lateral decubitus position left side up.  Axillary roll was placed and all bony prominences well-padded.  The anterior abdomen and posterior lumbar spine were all prepped and draped in a standard fashion.  Using fluoroscopy to identify the L4-5 disc space level and marked out the incision site.  At this point  time Dr. Donnetta Hutching, and Dr. Donzetta Matters performed a standard oblique retroperitoneal approach to the lumbar spine.  Please refer to his dictation for specifics.  Once the retractors were set and needle was placed into the L4-5 disc space level and an x-ray was taken using fluoroscopy to confirm that we are at the L4-5 level.  Once this was confirmed Dr. Donnetta Hutching and Dr. Donzetta Matters scrubbed out and I scrubbed in for the remainder of the procedure.  Confirming I was at the appropriate level and annulotomy was then performed with a 10 blade scalpel.  I then used pituitary rongeurs and curettes and Kerrison rongeurs to remove the bulk of the disc material.  I then placed my angled elevator along the inferior aspect of the L4 vertebral body and advanced it using fluoroscopic guidance until I was at the contralateral side.  I released the contralateral annulus and confirmed with fluoroscopy that the osteotome was beyond the lateral border of the vertebral body.  I then used the oyster elevator in order to adequately release the contralateral annulus.  Using the box osteotomes I remove the bulk of the cartilaginous endplate.  I then used the trial devices starting with a size 8 and then proceeding to a size 10 and then ultimately to a 12.  A size 12 provided the best overall fit.  This allowed restoration of the intervertebral disc space, reduction of the spondylolisthesis, and improvement in the foraminal space.  At this point time I had noticed some bleeding and so I reposition the retractor and placed FloSeal and packed the area.  I then rasped the endplates to ensure I had bleeding subchondral bone and  performed an irrigation of the wound.  I then obtained a size 12 x 50 mm cage and packed it with the allograft.  The cage was then directly inserted using fluoroscopic guidance.  I confirmed satisfactory position in both the AP and lateral planes.  The cage itself was well seated and was not loose.  I remove the inserting handle and then  took repeat x-rays.  I confirmed satisfactory position of the intervertebral cage.  At this point Dr. Donnetta Hutching scrubbed back into the case and evaluated the wound.  At this point there was no active bleeding and we achieved hemostasis.  Satisfied that we had hemostasis he scrubbed back out and I irrigated the wound copiously with normal saline.  I then removed sequentially the retractors.  The fascia of the external oblique was identified and closed with interrupted #1 Vicryl sutures.  The superficial layer was then closed with interrupted 2-0 Vicryl suture, and the skin with a 3-0 Monocryl.  An intraoperative AP x-ray was taken and read by the radiologist confirming that there was no retained surgical treatments in the wound.  At this point time I turned my attention to the pedicle screw fixation.  Using fluoroscopy identified the lateral border of the L5 and L4 pedicle.  I marked out this area and infiltrated with quarter percent Marcaine with epinephrine.  Small incisions were made and the Jamshidi needle was advanced percutaneously to the lateral aspect of the pedicle.  I confirmed satisfactory positioning using AP fluoroscopy.  I then advanced the Jamshidi needle while directly stimulating confirming that I had less factory position and trajectory.  As I neared the medial wall of the pedicle I switched to the lateral view.  I confirmed using this view that the tip of the Jamshidi needle was just beyond the posterior wall of the vertebral body.  This confirmed trajectory and positioning.  I advanced into the vertebral body and then placed the guidepin through the Jamshidi needle to cannulate the pedicle.  I repeated this exact same procedure at L5 and in the contralateral L4 and L5 pedicles.  At this point all 4 pedicles were cannulated.  I then measured and elected to use the 6.5 x 40 mm length pedicle screws.  Each screw was obtained and then advanced over the guidepin.  As I was advancing the screw I confirmed  that the guidepin was not inadvertently advancing, and that I was not generating any  abnormal EMG activity.  Once all 4 pedicle screws were positioned, I then directly stimulated the pedicle screw heads and there was no adverse activity at greater than 40 mA.  I then used the rod measure and then placed the 45 mm length rod.  I confirmed that the rod was properly seated and then I placed the locking caps.  All 4 locking caps were secured and then they were tightened according manufacture standards with the torque wrench.  I remove the inserting device for the run and took final x-rays.  I had satisfactory position of the intervertebral cage and the pedicle screw construct.  The inserting tabs for the pedicle screws were broken off and removed.  The 4 wounds in the back were irrigated and closed in a layered fashion with interrupted #1 Vicryl suture, 2-0 Vicryl suture, and 3-0 Monocryl.  Steri-Strips were applied to all wounds as was a dry dressing.  The patient was ultimately extubated transfer the PACU without incident.  The end of the case there was one needle missing.  Per protocol the xray was taken and it was negative.  After confirming the needle was not in the body, we transferred the patient to the PACU.  Sponge counts were correct.  There were no adverse intraoperative events.  Melina Schools, MD 09/25/2020 11:54 AM

## 2020-09-26 ENCOUNTER — Inpatient Hospital Stay (HOSPITAL_COMMUNITY): Payer: 59

## 2020-09-26 DIAGNOSIS — Z9889 Other specified postprocedural states: Secondary | ICD-10-CM | POA: Diagnosis not present

## 2020-09-26 MED FILL — Thrombin For Soln Kit 20000 Unit: CUTANEOUS | Qty: 1 | Status: AC

## 2020-09-26 NOTE — Evaluation (Signed)
Occupational Therapy Evaluation Patient Details Name: Lori [name] MRN: [id number] DOB: 18-Nov-1960 Today's Date: 09/26/2020    History of Present Illness 60 y.o. female presenting with L4-5 degenerative spondylolisthesis s/p PLIF. PMHx significant for DM, thyroid disease, OA, lumbago, and R/L TKA.   Clinical Impression   PTA patient was living with her husband in a private residence and was independent with ADLs/IADLs without AD. Patient currently presents near baseline level of function demonstrating supervision A grossly for observed ADLs without AD and slightly increased time 2/2 3/10 pain at incision. Patient reports pain radiating down into R hip, leg and ankle has resolved. OT provided education on spinal precautions, home set-up to maximize safety and independence with self-care tasks, wearing schedule for TED hose and acquisition/use of AE. Patient expressed verbal understanding. Patient also able to don lumbar corset without external assist. Patient does not require continued acute occupational therapy services with OT to sign off at this time. Recommendation for return home with husband who is able to provide assist as needed.     Follow Up Recommendations  No OT follow up;Supervision - Intermittent    Equipment Recommendations  None recommended by OT    Recommendations for Other Services       Precautions / Restrictions Precautions Precautions: Back Precaution Booklet Issued: Yes (comment) Precaution Comments: Reviewed written handout. Patient able to recall 3/3 precautions. Good carryover during ADLs. Required Braces or Orthoses: Spinal Brace Spinal Brace: Lumbar corset Restrictions Weight Bearing Restrictions: No      Mobility Bed Mobility Overal bed mobility: Modified Independent             General bed mobility comments: Good recall of log rolling technique +rail. No external assist needed.    Transfers Overall transfer level: Needs assistance Equipment  used: None Transfers: Sit to/from Stand Sit to Stand: Supervision         General transfer comment: Supervision A for safety 2/2 mild dizziness.    Balance Overall balance assessment: No apparent balance deficits (not formally assessed)                                         ADL either performed or assessed with clinical judgement   ADL Overall ADL's : Needs assistance/impaired                 Upper Body Dressing : Set up;Sitting   Lower Body Dressing: Sit to/from stand;Minimal assistance Lower Body Dressing Details (indicate cue type and reason): Min A with AE. Patient unable to attain figure-4 position. Toilet Transfer: Copy Details (indicate cue type and reason): Supervision A for safety.         Functional mobility during ADLs: Supervision/safety       Vision         Perception     Praxis      Pertinent Vitals/Pain Pain Assessment: 0-10 Pain Score: 3  Pain Location: Back (incisional). Patient PTA radiating down into R hip, leg and ankle has resolved. Pain Descriptors / Indicators: Aching;Sore Pain Intervention(s): Limited activity within patient's tolerance;Monitored during session;Premedicated before session;Repositioned     Hand Dominance Right   Extremity/Trunk Assessment Upper Extremity Assessment Upper Extremity Assessment: Overall WFL for tasks assessed   Lower Extremity Assessment Lower Extremity Assessment: Defer to PT evaluation   Cervical / Trunk Assessment Cervical / Trunk Assessment: Other exceptions Cervical / Trunk Exceptions: s/p  spinal surgery   Communication Communication Communication: No difficulties   Cognition Arousal/Alertness: Awake/alert Behavior During Therapy: WFL for tasks assessed/performed Overall Cognitive Status: Within Functional Limits for tasks assessed                                     General Comments  Clean, dry dressing at  incision.    Exercises     Shoulder Instructions      Home Living Family/patient expects to be discharged to:: Private residence Living Arrangements: Spouse/significant other Available Help at Discharge: Family;Available 24 hours/day Type of Home: House Home Access: Stairs to enter [employer] of Steps: 3 Entrance Stairs-Rails: Left (Ascending) Home Layout: Two level;Able to live on main level with bedroom/bathroom Alternate Level Stairs-Number of Steps: Full flight to 2nd level   Bathroom Shower/Tub: Walk-in shower   Bathroom Toilet: Handicapped height     Home Equipment: Bedside commode;Cane - single point   Additional Comments: May have a RW but unsure.      Prior Functioning/Environment Level of Independence: Independent        Comments: Independent with ADLs/IADLs without AD. Drives. Enjoys reading. Used to ride horses but had to give it up. Owns horses, cows, and dogs.        OT Problem List: Pain      OT Treatment/Interventions:      OT Goals(Current goals can be found in the care plan section) Acute Rehab OT Goals Patient Stated Goal: To return home. OT Goal Formulation: With patient  OT Frequency:     Barriers to D/C:            Co-evaluation              AM-PAC OT "6 Clicks" Daily Activity     Outcome Measure Help from another person eating meals?: None Help from another person taking care of personal grooming?: A Little Help from another person toileting, which includes using toliet, bedpan, or urinal?: A Little Help from another person bathing (including washing, rinsing, drying)?: A Little Help from another person to put on and taking off regular upper body clothing?: A Little Help from another person to put on and taking off regular lower body clothing?: A Little 6 Click Score: 19   End of Session Equipment Utilized During Treatment: Back brace Nurse Communication: Mobility status  Activity Tolerance: Patient tolerated  treatment well Patient left: in chair;with call bell/phone within reach  OT Visit Diagnosis: Muscle weakness (generalized) (M62.81)                Time: 1610-9604 OT Time Calculation (min): 29 min Charges:  OT General Charges $OT Visit: 1 Visit OT Evaluation $OT Eval Moderate Complexity: 1 Mod OT Treatments $Self Care/Home Management : 8-22 mins  Yisroel Mullendore H. OTR/L Supplemental OT, Department of rehab services [phone]  Nicolasa Milbrath R H. 09/26/2020, 8:41 AM

## 2020-09-26 NOTE — Progress Notes (Signed)
Patient is discharged from room 3C05. Alert and in stable condition. IV site d/c'd and instructions read to patient and spouse with understanding verbalized and all questions answered. Left unit via wheelchair with all belongings at side.

## 2020-09-26 NOTE — CV Procedure (Signed)
BLE venous duplex competed.  Results can be found under chart review under CV PROC. 09/26/2020 1:04 PM Briea Mcenery RVT, RDMS

## 2020-09-26 NOTE — Progress Notes (Signed)
Subjective: 1 Day Post-Op Procedure(s) (LRB): OBLIQUE LUMBAR INTERBODY FUSION (OLIF)  LUMBAR FOUR THROUGH FIVE, POSTERIOR SPINAL FUSION INTERBODY (N/A) ABDOMINAL EXPOSURE (N/A) Patient reports pain as 3 on 0-10 scale.   Leg pain improved.  +void, +flatus, -BM Tolerating PO, mild nausea. No vomiting.  +ambulation. Denies CP, calf pain, SOB  Objective: Vital signs in last 24 hours: Temp:  [97.9 F (36.6 C)-98.2 F (36.8 C)] 98 F (36.7 C) (03/10 0346) Pulse Rate:  [59-87] 59 (03/10 0346) Resp:  [10-20] 20 (03/10 0346) BP: (100-121)/(62-71) 102/62 (03/10 0346) SpO2:  [95 %-100 %] 97 % (03/10 0346)  Intake/Output from previous day: 03/09 0701 - 03/10 0700 In: 1570 [P.O.:120; I.V.:1300; IV Piggyback:100] Out: 600 [Urine:350; Blood:250] Intake/Output this shift: No intake/output data recorded.  Recent Labs    09/23/20 1059 09/25/20 1640  HGB 14.9 13.4   Recent Labs    09/23/20 1059 09/25/20 1640  WBC 7.7 14.2*  RBC 4.87 4.34  HCT 45.3 40.2  PLT 210 192   Recent Labs    09/23/20 1059 09/25/20 1640  NA 138  --   K 4.2  --   CL 103  --   CO2 26  --   BUN 17  --   CREATININE 0.87 0.87  GLUCOSE 96  --   CALCIUM 10.0  --    Recent Labs    09/23/20 1059  INR 1.0    Neurologically intact ABD soft Neurovascular intact Sensation intact distally Intact pulses distally Dorsiflexion/Plantar flexion intact Incision: scant drainage No cellulitis present Compartment soft   Assessment/Plan: 1 Day Post-Op Procedure(s) (LRB): OBLIQUE LUMBAR INTERBODY FUSION (OLIF)  LUMBAR FOUR THROUGH FIVE, POSTERIOR SPINAL FUSION INTERBODY (N/A) ABDOMINAL EXPOSURE (N/A) Advance diet Up with therapy Plan for LE doppler today.  DVT PPx: Teds, SCDs, ambulation As long as patient is cleared by PT/OT and her doppler is negative patient can D/C today.  Plan discussed with Dr. Rolena Infante.      Yvonne Kendall Ward 09/26/2020, 7:37 AM

## 2020-09-26 NOTE — Evaluation (Addendum)
Physical Therapy Evaluation Patient Details Name: Lori [name] MRN: [id number] DOB: 06-24-1961 Today's Date: 09/26/2020   History of Present Illness  Pt is a 60 y/o female presenting with L4-5 degenerative spondylolisthesis s/p PLIF on 09/25/2020. PMHx significant for DM, thyroid disease, OA, lumbago, and R/L TKA.    Clinical Impression  Pt admitted with above diagnosis. At the time of PT eval, pt was able to demonstrate transfers and ambulation with gross supervision for safety and no AD. Pt was educated on precautions, brace application/wearing schedule, appropriate activity progression, and car transfer. Pt currently with functional limitations due to the deficits listed below (see PT Problem List). Pt will benefit from skilled PT to increase their independence and safety with mobility to allow discharge to the venue listed below.      Follow Up Recommendations No PT follow up;Supervision - Intermittent    Equipment Recommendations  None recommended by PT    Recommendations for Other Services       Precautions / Restrictions Precautions Precautions: Back Precaution Booklet Issued: Yes (comment) Precaution Comments: Reviewed written handout. Patient able to recall 3/3 precautions. Required Braces or Orthoses: Spinal Brace Spinal Brace: Lumbar corset Restrictions Weight Bearing Restrictions: No      Mobility  Bed Mobility               General bed mobility comments: Pt sitting up in recliner chair upon PT arrival    Transfers Overall transfer level: Needs assistance Equipment used: None Transfers: Sit to/from Stand Sit to Stand: Supervision         General transfer comment: Light supervision for safety. No assistance required.  Ambulation/Gait Ambulation/Gait assistance: Supervision Gait Distance (Feet): 300 Feet Assistive device: None Gait Pattern/deviations: Step-through pattern;Decreased stride length;Trunk flexed Gait velocity: Decreased Gait velocity  interpretation: 1.31 - 2.62 ft/sec, indicative of limited community ambulator General Gait Details: Pt slow but generally steady without AD.  Stairs            Wheelchair Mobility    Modified Rankin (Stroke Patients Only)       Balance Overall balance assessment: Mild deficits observed, not formally tested                                           Pertinent Vitals/Pain Pain Assessment: Faces Faces Pain Scale: Hurts little more Pain Location: Back (incisional). Patient PTA radiating down into R hip, leg and ankle has resolved. Pain Descriptors / Indicators: Aching;Sore Pain Intervention(s): Limited activity within patient's tolerance;Monitored during session;Repositioned    Home Living Family/patient expects to be discharged to:: Private residence Living Arrangements: Spouse/significant other Available Help at Discharge: Family;Available 24 hours/day Type of Home: House Home Access: Stairs to enter Entrance Stairs-Rails: Left (Ascending) Entrance Stairs-Number of Steps: 3 Home Layout: Two level;Able to live on main level with bedroom/bathroom Home Equipment: Bedside commode;Cane - single point Additional Comments: May have a RW but unsure.    Prior Function Level of Independence: Independent         Comments: Independent with ADLs/IADLs without AD. Drives. Enjoys reading. Used to ride horses but had to give it up. Owns horses, cows, and dogs.     Hand Dominance   Dominant Hand: Right    Extremity/Trunk Assessment   Upper Extremity Assessment Upper Extremity Assessment: Defer to OT evaluation    Lower Extremity Assessment Lower Extremity Assessment: Generalized weakness (Consistent with pre-op  diagnosis)    Cervical / Trunk Assessment Cervical / Trunk Assessment: Other exceptions Cervical / Trunk Exceptions: s/p spinal surgery  Communication   Communication: No difficulties  Cognition Arousal/Alertness: Awake/alert Behavior During  Therapy: WFL for tasks assessed/performed Overall Cognitive Status: Within Functional Limits for tasks assessed                                        General Comments      Exercises     Assessment/Plan    PT Assessment Patient needs continued PT services  PT Problem List Decreased strength;Decreased activity tolerance;Decreased balance;Decreased mobility;Decreased knowledge of use of DME;Decreased safety awareness;Decreased knowledge of precautions;Pain       PT Treatment Interventions DME instruction;Gait training;Stair training;Functional mobility training;Therapeutic activities;Therapeutic exercise;Neuromuscular re-education;Patient/family education    PT Goals (Current goals can be found in the Care Plan section)  Acute Rehab PT Goals Patient Stated Goal: To return home. PT Goal Formulation: With patient Time For Goal Achievement: 10/03/20 Potential to Achieve Goals: Good    Frequency Min 5X/week   Barriers to discharge        Co-evaluation               AM-PAC PT "6 Clicks" Mobility  Outcome Measure Help needed turning from your back to your side while in a flat bed without using bedrails?: None Help needed moving from lying on your back to sitting on the side of a flat bed without using bedrails?: None Help needed moving to and from a bed to a chair (including a wheelchair)?: None Help needed standing up from a chair using your arms (e.g., wheelchair or bedside chair)?: None Help needed to walk in hospital room?: A Little Help needed climbing 3-5 steps with a railing? : A Little 6 Click Score: 22    End of Session Equipment Utilized During Treatment: Gait belt;Back brace Activity Tolerance: Patient tolerated treatment well Patient left: in bed;with call bell/phone within reach Nurse Communication: Mobility status PT Visit Diagnosis: Unsteadiness on feet (R26.81);Pain Pain - part of body:  (back)    Time: 7342-8768 PT Time Calculation  (min) (ACUTE ONLY): 16 min   Charges:   PT Evaluation $PT Eval Low Complexity: 1 Low          Rolinda Roan, PT, DPT Acute Rehabilitation Services Pager: [phone] Office: [phone]   Thelma Comp 09/26/2020, 1:44 PM

## 2020-09-27 ENCOUNTER — Encounter (HOSPITAL_COMMUNITY): Payer: Self-pay | Admitting: Orthopedic Surgery

## 2020-09-27 NOTE — Discharge Summary (Signed)
Patient ID: Lori [name] MRN: [id number] DOB/AGE: [birth year] 61 y.o.  Admit date: 09/25/2020 Discharge date: 09/27/2020  Admission Diagnoses:  Active Problems:   S/P lumbar fusion   Discharge Diagnoses:  Active Problems:   S/P lumbar fusion  status post Procedure(s): OBLIQUE LUMBAR INTERBODY FUSION (OLIF)  LUMBAR FOUR THROUGH FIVE, POSTERIOR SPINAL FUSION INTERBODY ABDOMINAL EXPOSURE  Past Medical History:  Diagnosis Date   Arthritis    Colitis    Costochondritis    Elevated liver enzymes    GERD (gastroesophageal reflux disease)    Hand pain    Headache    Hip pain    Hypothyroidism    Low back pain    Lumbar radiculopathy    Lumbar spondylosis    Ocular hypertension, bilateral    Pre-diabetes    controlled by diet and exercise   Restless leg syndrome    Seasonal allergies     Surgeries: Procedure(s): OBLIQUE LUMBAR INTERBODY FUSION (OLIF)  LUMBAR FOUR THROUGH FIVE, POSTERIOR SPINAL FUSION INTERBODY ABDOMINAL EXPOSURE on 09/25/2020   Consultants: Treatment Team:  Rosetta Posner, MD  Discharged Condition: Improved  Hospital Course: Lori [name] is an 60 y.o. 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 Patient failed conservative treatments (please see the history and physical for the specifics) and had severe unremitting pain that affects sleep, daily activities and work/hobbies. After pre-op clearance, the patient was taken to the operating room on 09/25/2020 and underwent  Procedure(s): OBLIQUE LUMBAR INTERBODY FUSION (OLIF)  LUMBAR FOUR THROUGH FIVE, POSTERIOR SPINAL FUSION INTERBODY ABDOMINAL EXPOSURE.    Patient was given perioperative antibiotics:  Anti-infectives (From admission, onward)   Start     Dose/Rate Route Frequency Ordered Stop   09/25/20 1700  ceFAZolin (ANCEF) IVPB 1 g/50 mL premix        1 g 100 mL/hr over 30 Minutes Intravenous Every 8 hours 09/25/20 1553 09/26/20  1009   09/25/20 0648  ceFAZolin (ANCEF) IVPB 2g/100 mL premix        2 g 200 mL/hr over 30 Minutes Intravenous 30 min pre-op 09/25/20 0648 09/25/20 6440       Patient was given sequential compression devices and early ambulation to prevent DVT.   Patient benefited maximally from hospital stay and there were no complications. At the time of discharge, the patient was urinating/moving their bowels without difficulty, tolerating a regular diet, pain is controlled with oral pain medications and they have been cleared by PT/OT.   Recent vital signs: No data found.   Recent laboratory studies:  Recent Labs    09/25/20 1640  WBC 14.2*  HGB 13.4  HCT 40.2  PLT 192  CREATININE 0.87     Discharge Medications:   Allergies as of 09/26/2020      Reactions   Other Nausea And Vomiting, Nausea Only, Other (See Comments)   Strong pain medications must be taken with nausea medicine  PRE-DIABETIC CONTROLLED THROUGH DIET   Codeine Nausea And Vomiting   Gabapentin Other (See Comments)   EXTREMELY DRY MOUTH   Celebrex [celecoxib] Other (See Comments)   Gives her a 'really bad headache'      Medication List    STOP taking these medications   aspirin-acetaminophen-caffeine 250-250-65 MG tablet Commonly known as: EXCEDRIN MIGRAINE   Calcium 150 MG Tabs   glucose blood test strip   Krill Oil 500 MG Caps   Lumigan 0.01 % Soln Generic drug: bimatoprost   multivitamin capsule  naproxen sodium 220 MG tablet Commonly known as: ALEVE   Osteo Bi-Flex Adv Double St Tabs   OVER THE COUNTER MEDICATION   polyvinyl alcohol 1.4 % ophthalmic solution Commonly known as: LIQUIFILM TEARS   PRESERVISION AREDS 2 PO   Q-10 CO-ENZYME PO   selenium 200 MCG Tabs tablet   Shingrix injection Generic drug: Zoster Vaccine Adjuvanted   triamcinolone 0.1 % Commonly known as: KENALOG     TAKE these medications   azelastine 0.1 % nasal spray Commonly known as: ASTELIN Place 1 spray into both  nostrils 2 (two) times daily as needed for rhinitis. Use in each nostril as directed   budesonide 3 MG 24 hr capsule Commonly known as: ENTOCORT EC Take 3 mg by mouth daily.   clonazePAM 1 MG tablet Commonly known as: KLONOPIN Take 1 mg by mouth at bedtime as needed (restless leg).   dicyclomine 10 MG capsule Commonly known as: BENTYL Take 10 mg by mouth 3 (three) times daily as needed for spasms.   enoxaparin 40 MG/0.4ML injection Commonly known as: LOVENOX Inject 0.4 mLs (40 mg total) into the skin daily for 10 days. 10 day supply 1 injection per day   ezetimibe 10 MG tablet Commonly known as: ZETIA Take 10 mg by mouth daily.   levothyroxine 50 MCG tablet Commonly known as: SYNTHROID Take 50 mcg by mouth daily before breakfast.   methocarbamol 500 MG tablet Commonly known as: Robaxin Take 1 tablet (500 mg total) by mouth every 8 (eight) hours as needed for up to 5 days for muscle spasms.   montelukast 10 MG tablet Commonly known as: SINGULAIR Take 10 mg by mouth at bedtime.   omeprazole 40 MG capsule Commonly known as: PRILOSEC Take 40 mg by mouth daily as needed (Acid reflux).   ondansetron 4 MG tablet Commonly known as: Zofran Take 1 tablet (4 mg total) by mouth every 8 (eight) hours as needed for nausea or vomiting.   oxyCODONE-acetaminophen 10-325 MG tablet Commonly known as: Percocet Take 1 tablet by mouth every 6 (six) hours as needed for up to 5 days for pain.   psyllium 58.6 % packet Commonly known as: METAMUCIL Take 1 packet by mouth daily.   saccharomyces boulardii 250 MG capsule Commonly known as: FLORASTOR Take 250 mg by mouth 2 (two) times daily.       Diagnostic Studies: DG Chest 2 View  Result Date: 09/23/2020 CLINICAL DATA:  Preoperative for OLIF EXAM: CHEST - 2 VIEW COMPARISON:  06/19/2008 chest radiograph. FINDINGS: Stable cardiomediastinal silhouette with normal heart size. No pneumothorax. No pleural effusion. Lungs appear clear, with  no acute consolidative airspace disease and no pulmonary edema. Surgical clips are seen in the right upper quadrant of the abdomen. IMPRESSION: No active cardiopulmonary disease. Electronically Signed   By: Ilona Sorrel M.D.   On: 09/23/2020 16:12   DG Lumbar Spine 2-3 Views  Result Date: 09/25/2020 CLINICAL DATA:  L4-L5 OLIF. EXAM: DG C-ARM 1-60 MIN; LUMBAR SPINE - 2-3 VIEW FLUOROSCOPY TIME:  Fluoroscopy Time:  4 minutes and 25 seconds. Radiation Exposure Index (if provided by the fluoroscopic device): 157.03 mGy. Number of Acquired Spot Images: 2 COMPARISON:  MRI lumbar spine February 10, 2020. FINDINGS: Two C-arm fluoroscopic images were obtained intraoperatively and submitted for post operative interpretation. These demonstrate bilateral pedicle screws at L4 and L5 with intervening rods and intervening L4-L5 spacer. Mild (grade 1) anterolisthesis of L4 on L5. Please see the performing provider's procedural report for further detail. IMPRESSION:  Intraoperative fluoroscopy, as detailed above. Electronically Signed   By: Margaretha Sheffield MD   On: 09/25/2020 12:40   DG C-Arm 1-60 Min  Result Date: 09/25/2020 CLINICAL DATA:  L4-L5 OLIF. EXAM: DG C-ARM 1-60 MIN; LUMBAR SPINE - 2-3 VIEW FLUOROSCOPY TIME:  Fluoroscopy Time:  4 minutes and 25 seconds. Radiation Exposure Index (if provided by the fluoroscopic device): 157.03 mGy. Number of Acquired Spot Images: 2 COMPARISON:  MRI lumbar spine February 10, 2020. FINDINGS: Two C-arm fluoroscopic images were obtained intraoperatively and submitted for post operative interpretation. These demonstrate bilateral pedicle screws at L4 and L5 with intervening rods and intervening L4-L5 spacer. Mild (grade 1) anterolisthesis of L4 on L5. Please see the performing provider's procedural report for further detail. IMPRESSION: Intraoperative fluoroscopy, as detailed above. Electronically Signed   By: Margaretha Sheffield MD   On: 09/25/2020 12:40   VAS [place] LOWER EXTREMITY VENOUS  (DVT)  Result Date: 09/26/2020  Lower Venous DVT Study Indications: Post-op ALIF - r/o DVT.  Comparison Study: No previous exams Performing Technologist: Rogelia Rohrer  Examination Guidelines: A complete evaluation includes B-mode imaging, spectral Doppler, color Doppler, and power Doppler as needed of all accessible portions of each vessel. Bilateral testing is considered an integral part of a complete examination. Limited examinations for reoccurring indications may be performed as noted. The reflux portion of the exam is performed with the patient in reverse Trendelenburg.  +---------+---------------+---------+-----------+----------+--------------+  RIGHT     Compressibility Phasicity Spontaneity Properties Thrombus Aging  +---------+---------------+---------+-----------+----------+--------------+  CFV       Full            Yes       Yes                                    +---------+---------------+---------+-----------+----------+--------------+  SFJ       Full                                                             +---------+---------------+---------+-----------+----------+--------------+  FV Prox   Full            Yes       Yes                                    +---------+---------------+---------+-----------+----------+--------------+  FV Mid    Full            Yes       Yes                                    +---------+---------------+---------+-----------+----------+--------------+  FV Distal Full            Yes       Yes                                    +---------+---------------+---------+-----------+----------+--------------+  PFV       Full                                                             +---------+---------------+---------+-----------+----------+--------------+  POP       Full            Yes       Yes                                    +---------+---------------+---------+-----------+----------+--------------+  PTV       Full                                                              +---------+---------------+---------+-----------+----------+--------------+  PERO      Full                                                             +---------+---------------+---------+-----------+----------+--------------+   +---------+---------------+---------+-----------+----------+--------------+  LEFT      Compressibility Phasicity Spontaneity Properties Thrombus Aging  +---------+---------------+---------+-----------+----------+--------------+  CFV       Full            Yes       Yes                                    +---------+---------------+---------+-----------+----------+--------------+  SFJ       Full                                                             +---------+---------------+---------+-----------+----------+--------------+  FV Prox   Full            Yes       Yes                                    +---------+---------------+---------+-----------+----------+--------------+  FV Mid    Full            Yes       Yes                                    +---------+---------------+---------+-----------+----------+--------------+  FV Distal Full            Yes       Yes                                    +---------+---------------+---------+-----------+----------+--------------+  PFV       Full                                                             +---------+---------------+---------+-----------+----------+--------------+  POP       Full            Yes       Yes                                    +---------+---------------+---------+-----------+----------+--------------+  PTV       Full                                                             +---------+---------------+---------+-----------+----------+--------------+  PERO      Full                                                             +---------+---------------+---------+-----------+----------+--------------+     Summary: BILATERAL: - No evidence of deep vein thrombosis seen in the lower extremities, bilaterally. - No evidence of  superficial venous thrombosis in the lower extremities, bilaterally. -No evidence of popliteal cyst, bilaterally.   *See table(s) above for measurements and observations. Electronically signed by Jamelle Haring on 09/26/2020 at 1:19:39 PM.    Final    DG OR LOCAL ABDOMEN  Result Date: 09/25/2020 CLINICAL DATA:  Missing needle.  L4-5 fusion. EXAM: OR LOCAL ABDOMEN COMPARISON:  Intra-abdominal radiograph. FINDINGS: Skin staples are stable. Discectomy and pedicle fusion hardware noted. No new radiopaque foreign body is present. Bowel gas pattern is unremarkable. IMPRESSION: 1. No new radiopaque foreign body. 2. Stable lumbar fusion. These results were called by telephone at the time of interpretation on 09/25/2020 at 1:07 pm to operating room 4. The results were verbally acknowledged. Electronically Signed   By: San Morelle M.D.   On: 09/25/2020 13:07   DG OR LOCAL ABDOMEN  Result Date: 09/25/2020 CLINICAL DATA:  60 year old female undergoing lumbar surgery. Query unintentional retained surgical foreign body. EXAM: OR LOCAL ABDOMEN COMPARISON:  CT Abdomen and Pelvis 08/25/2019. FINDINGS: Left-side up cross-table lateral portable view of the abdomen at 1056 hours. Hypoplastic ribs at T12. New L4-L5 interbody implant device. Eight metallic skin staples also project obliquely along the right abdomen and pelvis. Stable cholecystectomy clips. No radiopaque foreign body identified. Bowel-gas pattern within normal limits. IMPRESSION: No unexpected radiopaque foreign body identified. New L4-L5 interbody implant. Study discussed by telephone with OR 4 personnel on 09/25/2020 at 11:17 . Electronically Signed   By: Genevie Ann M.D.   On: 09/25/2020 11:18    Discharge Instructions    Incentive spirometry RT   Complete by: As directed        Follow-up Information    Melina Schools, MD. Schedule an appointment as soon as possible for a visit in 2 weeks.   Specialty: Orthopedic Surgery Why: If symptoms worsen, For  suture removal, For wound re-check Contact information: [address]  [place] [address] [phone]               Discharge Plan:  discharge to home  Disposition: stable    Signed: Yvonne Kendall Carlee Vonderhaar for [facility] PA-C [facility] [phone] 09/27/2020, 4:46 PM

## 2020-10-11 ENCOUNTER — Other Ambulatory Visit (HOSPITAL_COMMUNITY): Payer: Self-pay | Admitting: Orthopedic Surgery

## 2020-10-11 ENCOUNTER — Other Ambulatory Visit: Payer: Self-pay

## 2020-10-11 ENCOUNTER — Ambulatory Visit (HOSPITAL_COMMUNITY)
Admission: RE | Admit: 2020-10-11 | Discharge: 2020-10-11 | Disposition: A | Discharge status: Home / Self Care | Payer: 59 | Source: Ambulatory Visit | Attending: Cardiology | Admitting: Cardiology

## 2020-10-11 DIAGNOSIS — M79605 Pain in left leg: Secondary | ICD-10-CM | POA: Insufficient documentation

## 2020-10-11 DIAGNOSIS — M79604 Pain in right leg: Secondary | ICD-10-CM

## 2021-03-18 ENCOUNTER — Other Ambulatory Visit: Payer: Self-pay | Admitting: Gastroenterology

## 2021-03-18 DIAGNOSIS — R7989 Other specified abnormal findings of blood chemistry: Secondary | ICD-10-CM

## 2021-04-06 ENCOUNTER — Other Ambulatory Visit: Payer: Self-pay

## 2021-04-06 ENCOUNTER — Ambulatory Visit
Admission: RE | Admit: 2021-04-06 | Discharge: 2021-04-06 | Disposition: A | Discharge status: Home / Self Care | Payer: 59 | Source: Ambulatory Visit | Attending: Gastroenterology | Admitting: Gastroenterology

## 2021-04-06 DIAGNOSIS — R7989 Other specified abnormal findings of blood chemistry: Secondary | ICD-10-CM

## 2021-04-06 MED ORDER — GADOBENATE DIMEGLUMINE 529 MG/ML IV SOLN
16.0000 mL | Freq: Once | INTRAVENOUS | Status: AC | PRN
  Administered 2021-04-06: 16 mL via INTRAVENOUS

## 2021-06-03 ENCOUNTER — Other Ambulatory Visit: Payer: Self-pay | Admitting: Urology

## 2021-07-15 NOTE — Progress Notes (Signed)
Covid test on 07/30/2021 at 0915am Come thru [place] at [facility] have a seat in the lobby to the right.  Call [phone] and tell  them your name and that you are here for covid testing.                 Lori [name]  07/15/2021   Your procedure is scheduled on:             08/01/2021   Report to [facility] Main  Entrance   Report to admitting at   [phone]     Call this number if you have problems the morning of surgery [phone]    Remember: Do not eat food , candy gum or mints :After Midnight. You may have clear liquids from midnight until  0700a,   Mix 255 g of miralax in 64 ounces of Gatorade.         CLEAR LIQUID DIET   Foods Allowed                                                                       Coffee and tea, regular and decaf                              Plain Jell-O any favor except red or purple                                            Fruit ices (not with fruit pulp)                                      Iced Popsicles                                     Carbonated beverages, regular and diet                                    Cranberry, grape and apple juices Sports drinks like Gatorade Lightly seasoned clear broth or consume(fat free) Sugar   _____________________________________________________________________    BRUSH YOUR TEETH MORNING OF SURGERY AND RINSE YOUR MOUTH OUT, NO CHEWING GUM CANDY OR MINTS.     Take these medicines the morning of surgery with A SIP OF WATER:  endocort, synthroid, prilosec   DO NOT TAKE ANY DIABETIC MEDICATIONS DAY OF YOUR SURGERY                               You may not have any metal on your body including hair pins and              piercings  Do not wear jewelry, make-up, lotions, powders or perfumes, deodorant             Do not wear nail  polish on your fingernails.  Do not shave  48 hours prior to surgery.              Men may shave face and neck.   Do not bring valuables to the  hospital. [place].  Contacts, dentures or bridgework may not be worn into surgery.  Leave suitcase in the car. After surgery it may be brought to your room.     Patients discharged the day of surgery will not be allowed to drive home. IF YOU ARE HAVING SURGERY AND GOING HOME THE SAME DAY, YOU MUST HAVE AN ADULT TO DRIVE YOU HOME AND BE WITH YOU FOR 24 HOURS. YOU MAY GO HOME BY TAXI OR UBER OR ORTHERWISE, BUT AN ADULT MUST ACCOMPANY YOU HOME AND STAY WITH YOU FOR 24 HOURS.  Name and phone number of your driver:  Special Instructions: N/A              Please read over the following fact sheets you were given: _____________________________________________________________________  [facility] - Preparing for Surgery Before surgery, you can play an important role.  Because skin is not sterile, your skin needs to be as free of germs as possible.  You can reduce the number of germs on your skin by washing with CHG (chlorahexidine gluconate) soap before surgery.  CHG is an antiseptic cleaner which kills germs and bonds with the skin to continue killing germs even after washing. Please DO NOT use if you have an allergy to CHG or antibacterial soaps.  If your skin becomes reddened/irritated stop using the CHG and inform your nurse when you arrive at Short Stay. Do not shave (including legs and underarms) for at least 48 hours prior to the first CHG shower.  You may shave your face/neck. Please follow these instructions carefully:  1.  Shower with CHG Soap the night before surgery and the  morning of Surgery.  2.  If you choose to wash your hair, wash your hair first as usual with your  normal  shampoo.  3.  After you shampoo, rinse your hair and body thoroughly to remove the  shampoo.                           4.  Use CHG as you would any other liquid soap.  You can apply chg directly  to the skin and wash                       Gently with a scrungie or  clean washcloth.  5.  Apply the CHG Soap to your body ONLY FROM THE NECK DOWN.   Do not use on face/ open                           Wound or open sores. Avoid contact with eyes, ears mouth and genitals (private parts).                       Wash face,  Genitals (private parts) with your normal soap.             6.  Wash thoroughly, paying special attention to the area where your surgery  will be performed.  7.  Thoroughly rinse your body with warm water from  the neck down.  8.  DO NOT shower/wash with your normal soap after using and rinsing off  the CHG Soap.                9.  Pat yourself dry with a clean towel.            10.  Wear clean pajamas.            11.  Place clean sheets on your bed the night of your first shower and do not  sleep with pets. Day of Surgery : Do not apply any lotions/deodorants the morning of surgery.  Please wear clean clothes to the hospital/surgery center.  FAILURE TO FOLLOW THESE INSTRUCTIONS MAY RESULT IN THE CANCELLATION OF YOUR SURGERY PATIENT SIGNATURE_________________________________  NURSE SIGNATURE__________________________________  ________________________________________________________________________

## 2021-07-15 NOTE — Progress Notes (Signed)
Anesthesia Review:  PCP: Cardiologist : Chest x-ray : EKG : Echo : Stress test: Cardiac Cath :  Activity level:  Sleep Study/ CPAP : Fasting Blood Sugar :      / Checks Blood Sugar -- times a day:   Blood Thinner/ Instructions /Last Dose: ASA / Instructions/ Last Dose :  

## 2021-07-18 ENCOUNTER — Other Ambulatory Visit: Payer: Self-pay

## 2021-07-18 ENCOUNTER — Encounter (HOSPITAL_COMMUNITY): Payer: Self-pay

## 2021-07-18 ENCOUNTER — Encounter (HOSPITAL_COMMUNITY)
Admission: RE | Admit: 2021-07-18 | Discharge: 2021-07-18 | Disposition: A | Discharge status: Home / Self Care | Payer: 59 | Source: Ambulatory Visit | Attending: Urology | Admitting: Urology

## 2021-07-18 VITALS — BP 126/71 | HR 69 | Temp 98.4°F | Resp 16 | Ht 66.0 in | Wt 168.0 lb

## 2021-07-18 DIAGNOSIS — R7303 Prediabetes: Secondary | ICD-10-CM | POA: Diagnosis not present

## 2021-07-18 DIAGNOSIS — Z01812 Encounter for preprocedural laboratory examination: Secondary | ICD-10-CM | POA: Insufficient documentation

## 2021-07-18 DIAGNOSIS — Z01818 Encounter for other preprocedural examination: Secondary | ICD-10-CM

## 2021-07-18 LAB — CBC
HCT: 43.1 % (ref 36.0–46.0)
Hemoglobin: 14.3 g/dL (ref 12.0–15.0)
MCH: 30.9 pg (ref 26.0–34.0)
MCHC: 33.2 g/dL (ref 30.0–36.0)
MCV: 93.1 fL (ref 80.0–100.0)
Platelets: 216 10*3/uL (ref 150–400)
RBC: 4.63 MIL/uL (ref 3.87–5.11)
RDW: 14 % (ref 11.5–15.5)
WBC: 9 10*3/uL (ref 4.0–10.5)
nRBC: 0 % (ref 0.0–0.2)

## 2021-07-18 LAB — COMPREHENSIVE METABOLIC PANEL
ALT: 94 U/L — ABNORMAL HIGH (ref 0–44)
AST: 67 U/L — ABNORMAL HIGH (ref 15–41)
Albumin: 4.4 g/dL (ref 3.5–5.0)
Alkaline Phosphatase: 112 U/L (ref 38–126)
Anion gap: 10 (ref 5–15)
BUN: 17 mg/dL (ref 8–23)
CO2: 25 mmol/L (ref 22–32)
Calcium: 9.3 mg/dL (ref 8.9–10.3)
Chloride: 104 mmol/L (ref 98–111)
Creatinine, Ser: 0.73 mg/dL (ref 0.44–1.00)
GFR, Estimated: >60 mL/min (ref >60)
Glucose, Bld: 111 mg/dL — ABNORMAL HIGH (ref 70–99)
Potassium: 3.8 mmol/L (ref 3.5–5.1)
Sodium: 139 mmol/L (ref 135–145)
Total Bilirubin: 0.5 mg/dL (ref 0.3–1.2)
Total Protein: 7.1 g/dL (ref 6.5–8.1)

## 2021-07-18 LAB — TYPE AND SCREEN
ABO/RH(D): A POS
Antibody Screen: NEGATIVE

## 2021-07-18 LAB — HEMOGLOBIN A1C
Hgb A1c MFr Bld: 6 % — ABNORMAL HIGH (ref 4.8–5.6)
Mean Plasma Glucose: 125.5 mg/dL

## 2021-07-18 LAB — GLUCOSE, CAPILLARY: Glucose-Capillary: 124 mg/dL — ABNORMAL HIGH (ref 70–99)

## 2021-07-18 NOTE — Progress Notes (Addendum)
Anesthesia Review: ALT 94  PCP: Cardiologist : Chest x-ray :09-23-20 epic EKG : Echo : Stress test: Cardiac Cath :  Activity level: Able to walk a flight of stairs without SOB Sleep Study/ CPAP : Fasting Blood Sugar :      / Checks Blood Sugar -- times a day:   Blood Thinner/ Instructions /Last Dose: ASA / Instructions/ Last Dose :

## 2021-07-18 NOTE — Progress Notes (Signed)
Covid test on 07/30/2021 at 0915am Come thru [place] at [facility] have a seat in the lobby to the right.  Call [phone] and tell  them your name and that you are here for covid testing.                 Lori [name]  07/18/2021   Your procedure is scheduled on:             08/01/2021   Report to [facility] Main  Entrance   Report to admitting at   [phone]     Call this number if you have problems the morning of surgery [phone]    Remember: Follow a clear liquid diet the day of your bowel prep and follow bowel prep per MD order   You may have clear liquids from midnight until  0700a,    Mix 255 g of miralax in 64 ounces of Gatorade.         CLEAR LIQUID DIET   Foods Allowed                                                                       Coffee and tea, regular and decaf                              Plain Jell-O any favor except red or purple                                            Fruit ices (not with fruit pulp)                                      Iced Popsicles                                     Carbonated beverages, regular and diet                                    Cranberry, grape and apple juices Sports drinks like Gatorade Lightly seasoned clear broth or consume(fat free) Sugar   _____________________________________________________________________    BRUSH YOUR TEETH MORNING OF SURGERY AND RINSE YOUR MOUTH OUT, NO CHEWING GUM CANDY OR MINTS.     Take these medicines the morning of surgery with A SIP OF WATER:  endocort, synthroid, prilosec   DO NOT TAKE ANY DIABETIC MEDICATIONS DAY OF YOUR SURGERY                               You may not have any metal on your body including hair pins and              piercings  Do not wear jewelry, make-up, lotions, powders or perfumes, deodorant  Do not wear nail polish on your fingernails.  Do not shave  48 hours prior to surgery.              Men may shave face and  neck.   Do not bring valuables to the hospital. [place].  Contacts, dentures or bridgework may not be worn into surgery.  Leave suitcase in the car. After surgery it may be brought to your room.     Patients discharged the day of surgery will not be allowed to drive home. IF YOU ARE HAVING SURGERY AND GOING HOME THE SAME DAY, YOU MUST HAVE AN ADULT TO DRIVE YOU HOME AND BE WITH YOU FOR 24 HOURS. YOU MAY GO HOME BY TAXI OR UBER OR ORTHERWISE, BUT AN ADULT MUST ACCOMPANY YOU HOME AND STAY WITH YOU FOR 24 HOURS.  Name and phone number of your driver:  Special Instructions: N/A              Please read over the following fact sheets you were given: _____________________________________________________________________  [facility] - Preparing for Surgery Before surgery, you can play an important role.  Because skin is not sterile, your skin needs to be as free of germs as possible.  You can reduce the number of germs on your skin by washing with CHG (chlorahexidine gluconate) soap before surgery.  CHG is an antiseptic cleaner which kills germs and bonds with the skin to continue killing germs even after washing. Please DO NOT use if you have an allergy to CHG or antibacterial soaps.  If your skin becomes reddened/irritated stop using the CHG and inform your nurse when you arrive at Short Stay. Do not shave (including legs and underarms) for at least 48 hours prior to the first CHG shower.  You may shave your face/neck. Please follow these instructions carefully:  1.  Shower with CHG Soap the night before surgery and the  morning of Surgery.  2.  If you choose to wash your hair, wash your hair first as usual with your  normal  shampoo.  3.  After you shampoo, rinse your hair and body thoroughly to remove the  shampoo.                           4.  Use CHG as you would any other liquid soap.  You can apply chg directly  to the skin and wash                        Gently with a scrungie or clean washcloth.  5.  Apply the CHG Soap to your body ONLY FROM THE NECK DOWN.   Do not use on face/ open                           Wound or open sores. Avoid contact with eyes, ears mouth and genitals (private parts).                       Wash face,  Genitals (private parts) with your normal soap.             6.  Wash thoroughly, paying special attention to the area where your surgery  will be performed.  7.  Thoroughly rinse your body  with warm water from the neck down.  8.  DO NOT shower/wash with your normal soap after using and rinsing off  the CHG Soap.                9.  Pat yourself dry with a clean towel.            10.  Wear clean pajamas.            11.  Place clean sheets on your bed the night of your first shower and do not  sleep with pets. Day of Surgery : Do not apply any lotions/deodorants the morning of surgery.  Please wear clean clothes to the hospital/surgery center.  FAILURE TO FOLLOW THESE INSTRUCTIONS MAY RESULT IN THE CANCELLATION OF YOUR SURGERY PATIENT SIGNATURE_________________________________  NURSE SIGNATURE__________________________________  ________________________________________________________________________

## 2021-07-19 LAB — URINE CULTURE

## 2021-07-30 ENCOUNTER — Encounter (HOSPITAL_COMMUNITY)
Admission: RE | Admit: 2021-07-30 | Discharge: 2021-07-30 | Disposition: A | Discharge status: Home / Self Care | Payer: 59 | Source: Ambulatory Visit | Attending: Urology | Admitting: Urology

## 2021-07-30 ENCOUNTER — Other Ambulatory Visit: Payer: Self-pay

## 2021-07-30 DIAGNOSIS — Z01812 Encounter for preprocedural laboratory examination: Secondary | ICD-10-CM | POA: Insufficient documentation

## 2021-07-30 DIAGNOSIS — Z20822 Contact with and (suspected) exposure to covid-19: Secondary | ICD-10-CM | POA: Diagnosis not present

## 2021-07-30 DIAGNOSIS — Z1152 Encounter for screening for COVID-19: Secondary | ICD-10-CM

## 2021-07-30 LAB — SARS CORONAVIRUS 2 (TAT 6-24 HRS): SARS Coronavirus 2: NEGATIVE

## 2021-07-31 NOTE — Anesthesia Preprocedure Evaluation (Addendum)
Anesthesia Evaluation  Patient identified by MRN, date of birth, ID band Patient awake    Reviewed: Allergy & Precautions, NPO status , Patient's Chart, lab work & pertinent test results  Airway Mallampati: II  TM Distance: >3 FB Neck ROM: Full    Dental no notable dental hx. (+) Teeth Intact, Dental Advisory Given   Pulmonary neg pulmonary ROS,    Pulmonary exam normal breath sounds clear to auscultation       Cardiovascular Exercise Tolerance: Good Normal cardiovascular exam Rhythm:Regular Rate:Normal     Neuro/Psych  Headaches,    GI/Hepatic GERD  ,  Endo/Other  diabetesHypothyroidism   Renal/GU    R Renal mass    Musculoskeletal  (+) Arthritis ,   Abdominal   Peds  Hematology Lab Results      Component                Value               Date                      WBC                      9.0                 07/18/2021                HGB                      14.3                07/18/2021                HCT                      43.1                07/18/2021                PLT                      216                 07/18/2021              Anesthesia Other Findings All: gabapentin, celebrex, codeine  Reproductive/Obstetrics                           Anesthesia Physical Anesthesia Plan  ASA: 2  Anesthesia Plan: General   Post-op Pain Management: Lidocaine infusion, Ketamine IV and Ofirmev IV (intra-op)   Induction: Intravenous  PONV Risk Score and Plan: 4 or greater and Treatment may vary due to age or medical condition, Midazolam, Dexamethasone and Ondansetron  Airway Management Planned: Oral ETT  Additional Equipment: None  Intra-op Plan:   Post-operative Plan: Extubation in OR  Informed Consent: I have reviewed the patients History and Physical, chart, labs and discussed the procedure including the risks, benefits and alternatives for the proposed anesthesia with the  patient or authorized representative who has indicated his/her understanding and acceptance.     Dental advisory given  Plan Discussed with: CRNA and Anesthesiologist  Anesthesia Plan Comments:        Anesthesia Quick Evaluation

## 2021-08-01 ENCOUNTER — Ambulatory Visit (HOSPITAL_COMMUNITY): Payer: 59 | Admitting: Emergency Medicine

## 2021-08-01 ENCOUNTER — Other Ambulatory Visit: Payer: Self-pay

## 2021-08-01 ENCOUNTER — Ambulatory Visit (HOSPITAL_COMMUNITY): Payer: 59 | Admitting: Anesthesiology

## 2021-08-01 ENCOUNTER — Encounter (HOSPITAL_COMMUNITY): Payer: Self-pay | Admitting: Urology

## 2021-08-01 ENCOUNTER — Encounter (HOSPITAL_COMMUNITY)
Admission: RE | Disposition: A | Discharge status: Home / Self Care | Payer: Self-pay | Source: Ambulatory Visit | Attending: Urology

## 2021-08-01 ENCOUNTER — Observation Stay (HOSPITAL_COMMUNITY)
Admission: RE | Admit: 2021-08-01 | Discharge: 2021-08-02 | Disposition: A | Discharge status: Home / Self Care | Payer: 59 | Source: Ambulatory Visit | Attending: Urology | Admitting: Urology

## 2021-08-01 DIAGNOSIS — N2889 Other specified disorders of kidney and ureter: Secondary | ICD-10-CM

## 2021-08-01 DIAGNOSIS — Z79899 Other long term (current) drug therapy: Secondary | ICD-10-CM | POA: Insufficient documentation

## 2021-08-01 DIAGNOSIS — Z01818 Encounter for other preprocedural examination: Secondary | ICD-10-CM

## 2021-08-01 DIAGNOSIS — D3001 Benign neoplasm of right kidney: Principal | ICD-10-CM | POA: Insufficient documentation

## 2021-08-01 DIAGNOSIS — Z96653 Presence of artificial knee joint, bilateral: Secondary | ICD-10-CM | POA: Insufficient documentation

## 2021-08-01 DIAGNOSIS — E119 Type 2 diabetes mellitus without complications: Secondary | ICD-10-CM | POA: Insufficient documentation

## 2021-08-01 DIAGNOSIS — E039 Hypothyroidism, unspecified: Secondary | ICD-10-CM | POA: Insufficient documentation

## 2021-08-01 HISTORY — PX: ROBOTIC ASSITED PARTIAL NEPHRECTOMY: SHX6087

## 2021-08-01 LAB — BASIC METABOLIC PANEL
Anion gap: 10 (ref 5–15)
BUN: 14 mg/dL (ref 8–23)
CO2: 22 mmol/L (ref 22–32)
Calcium: 9.1 mg/dL (ref 8.9–10.3)
Chloride: 104 mmol/L (ref 98–111)
Creatinine, Ser: 0.82 mg/dL (ref 0.44–1.00)
GFR, Estimated: >60 mL/min (ref >60)
Glucose, Bld: 140 mg/dL — ABNORMAL HIGH (ref 70–99)
Potassium: 4.1 mmol/L (ref 3.5–5.1)
Sodium: 136 mmol/L (ref 135–145)

## 2021-08-01 LAB — HEMOGLOBIN AND HEMATOCRIT, BLOOD
HCT: 40.9 % (ref 36.0–46.0)
Hemoglobin: 13.5 g/dL (ref 12.0–15.0)

## 2021-08-01 SURGERY — NEPHRECTOMY, PARTIAL, ROBOT-ASSISTED
Anesthesia: General | Laterality: Right

## 2021-08-01 MED ORDER — TRAMADOL HCL 50 MG PO TABS: 100.0000 mg | ORAL_TABLET | Freq: Four times a day (QID) | ORAL | 0 refills | Status: DC | PRN

## 2021-08-01 MED ORDER — PHENYLEPHRINE 40 MCG/ML (10ML) SYRINGE FOR IV PUSH (FOR BLOOD PRESSURE SUPPORT)
PREFILLED_SYRINGE | INTRAVENOUS | Status: AC
  Filled 2021-08-01: qty 10

## 2021-08-01 MED ORDER — STERILE WATER FOR IRRIGATION IR SOLN
Status: DC | PRN
  Administered 2021-08-01: 1000 mL

## 2021-08-01 MED ORDER — POLYETHYL GLYCOL-PROPYL GLYCOL 0.4-0.3 % OP GEL: Freq: Every day | OPHTHALMIC | Status: DC

## 2021-08-01 MED ORDER — SCOPOLAMINE 1 MG/3DAYS TD PT72
1.0000 | MEDICATED_PATCH | TRANSDERMAL | Status: DC
  Administered 2021-08-01: 1.5 mg via TRANSDERMAL
  Filled 2021-08-01: qty 1

## 2021-08-01 MED ORDER — LATANOPROST 0.005 % OP SOLN
1.0000 [drp] | Freq: Every day | OPHTHALMIC | Status: DC
  Administered 2021-08-01: 1 [drp] via OPHTHALMIC
  Filled 2021-08-01: qty 2.5

## 2021-08-01 MED ORDER — ACETAMINOPHEN 10 MG/ML IV SOLN: 1000.0000 mg | Freq: Once | INTRAVENOUS | Status: DC | PRN

## 2021-08-01 MED ORDER — ACETAMINOPHEN 10 MG/ML IV SOLN
INTRAVENOUS | Status: AC
  Administered 2021-08-01: 1000 mg via INTRAVENOUS
  Filled 2021-08-01: qty 100

## 2021-08-01 MED ORDER — TRAMADOL HCL 50 MG PO TABS: 100.0000 mg | ORAL_TABLET | Freq: Four times a day (QID) | ORAL | Status: DC | PRN

## 2021-08-01 MED ORDER — PROPOFOL 10 MG/ML IV BOLUS
INTRAVENOUS | Status: AC
  Filled 2021-08-01: qty 20

## 2021-08-01 MED ORDER — OXYCODONE HCL 5 MG PO TABS
5.0000 mg | ORAL_TABLET | Freq: Once | ORAL | Status: AC | PRN
  Administered 2021-08-01: 5 mg via ORAL

## 2021-08-01 MED ORDER — LACTATED RINGERS IR SOLN
Status: DC | PRN
  Administered 2021-08-01: 1000 mL

## 2021-08-01 MED ORDER — PANTOPRAZOLE SODIUM 40 MG PO TBEC
40.0000 mg | DELAYED_RELEASE_TABLET | Freq: Every day | ORAL | Status: DC
  Administered 2021-08-02: 40 mg via ORAL
  Filled 2021-08-01: qty 1

## 2021-08-01 MED ORDER — DEXAMETHASONE SODIUM PHOSPHATE 10 MG/ML IJ SOLN
INTRAMUSCULAR | Status: AC
  Filled 2021-08-01: qty 1

## 2021-08-01 MED ORDER — ORAL CARE MOUTH RINSE
15.0000 mL | Freq: Once | OROMUCOSAL | Status: AC
  Administered 2021-08-01: 15 mL via OROMUCOSAL

## 2021-08-01 MED ORDER — KETAMINE HCL 50 MG/5ML IJ SOSY
PREFILLED_SYRINGE | INTRAMUSCULAR | Status: AC
  Filled 2021-08-01: qty 5

## 2021-08-01 MED ORDER — MIDAZOLAM HCL 2 MG/2ML IJ SOLN
INTRAMUSCULAR | Status: DC | PRN
Start: 2021-08-01 — End: 2021-08-01
  Administered 2021-08-01: 2 mg via INTRAVENOUS

## 2021-08-01 MED ORDER — HYDROMORPHONE HCL 1 MG/ML IJ SOLN
INTRAMUSCULAR | Status: AC
  Administered 2021-08-01: 0.5 mg via INTRAVENOUS
  Filled 2021-08-01: qty 1

## 2021-08-01 MED ORDER — PHENYLEPHRINE 40 MCG/ML (10ML) SYRINGE FOR IV PUSH (FOR BLOOD PRESSURE SUPPORT)
PREFILLED_SYRINGE | INTRAVENOUS | Status: DC | PRN
  Administered 2021-08-01: 120 ug via INTRAVENOUS

## 2021-08-01 MED ORDER — CHLORHEXIDINE GLUCONATE 0.12 % MT SOLN: 15.0000 mL | Freq: Once | OROMUCOSAL | Status: AC

## 2021-08-01 MED ORDER — HYDROMORPHONE HCL 1 MG/ML IJ SOLN: 0.5000 mg | INTRAMUSCULAR | Status: DC | PRN

## 2021-08-01 MED ORDER — OXYCODONE HCL 5 MG/5ML PO SOLN: 5.0000 mg | Freq: Once | ORAL | Status: AC | PRN

## 2021-08-01 MED ORDER — BUPIVACAINE LIPOSOME 1.3 % IJ SUSP
INTRAMUSCULAR | Status: DC | PRN
  Administered 2021-08-01: 20 mL

## 2021-08-01 MED ORDER — AMISULPRIDE (ANTIEMETIC) 5 MG/2ML IV SOLN
10.0000 mg | Freq: Once | INTRAVENOUS | Status: AC | PRN
  Administered 2021-08-01: 10 mg via INTRAVENOUS

## 2021-08-01 MED ORDER — CEFAZOLIN SODIUM-DEXTROSE 1-4 GM/50ML-% IV SOLN
1.0000 g | Freq: Three times a day (TID) | INTRAVENOUS | Status: AC
  Administered 2021-08-01 (×2): 1 g via INTRAVENOUS
  Filled 2021-08-01 (×2): qty 50

## 2021-08-01 MED ORDER — CHLORHEXIDINE GLUCONATE CLOTH 2 % EX PADS: 6.0000 | MEDICATED_PAD | Freq: Every day | CUTANEOUS | Status: DC

## 2021-08-01 MED ORDER — AMISULPRIDE (ANTIEMETIC) 5 MG/2ML IV SOLN
INTRAVENOUS | Status: AC
  Filled 2021-08-01: qty 2

## 2021-08-01 MED ORDER — ONDANSETRON HCL 4 MG PO TABS: 4.0000 mg | ORAL_TABLET | Freq: Three times a day (TID) | ORAL | 0 refills | Status: DC | PRN

## 2021-08-01 MED ORDER — ONDANSETRON HCL 4 MG/2ML IJ SOLN
INTRAMUSCULAR | Status: AC
  Filled 2021-08-01: qty 2

## 2021-08-01 MED ORDER — SODIUM CHLORIDE 0.9 % IV SOLN: 250.0000 mL | INTRAVENOUS | Status: DC | PRN

## 2021-08-01 MED ORDER — BUPIVACAINE-EPINEPHRINE (PF) 0.5% -1:200000 IJ SOLN
INTRAMUSCULAR | Status: DC | PRN
  Administered 2021-08-01: 30 mL

## 2021-08-01 MED ORDER — BUPIVACAINE-EPINEPHRINE (PF) 0.5% -1:200000 IJ SOLN
INTRAMUSCULAR | Status: AC
  Filled 2021-08-01: qty 30

## 2021-08-01 MED ORDER — ROCURONIUM BROMIDE 10 MG/ML (PF) SYRINGE
PREFILLED_SYRINGE | INTRAVENOUS | Status: AC
  Filled 2021-08-01: qty 10

## 2021-08-01 MED ORDER — MONTELUKAST SODIUM 10 MG PO TABS
10.0000 mg | ORAL_TABLET | Freq: Every day | ORAL | Status: DC
  Administered 2021-08-01: 10 mg via ORAL
  Filled 2021-08-01: qty 1

## 2021-08-01 MED ORDER — LEVOTHYROXINE SODIUM 50 MCG PO TABS
50.0000 ug | ORAL_TABLET | Freq: Every day | ORAL | Status: DC
  Administered 2021-08-02: 50 ug via ORAL
  Filled 2021-08-01 (×2): qty 1

## 2021-08-01 MED ORDER — LIDOCAINE 2% (20 MG/ML) 5 ML SYRINGE
INTRAMUSCULAR | Status: DC | PRN
  Administered 2021-08-01: 60 mg via INTRAVENOUS

## 2021-08-01 MED ORDER — SODIUM CHLORIDE 0.9% FLUSH
3.0000 mL | Freq: Two times a day (BID) | INTRAVENOUS | Status: DC
  Administered 2021-08-02: 3 mL via INTRAVENOUS

## 2021-08-01 MED ORDER — ONDANSETRON HCL 4 MG/2ML IJ SOLN
INTRAMUSCULAR | Status: DC | PRN
Start: 2021-08-01 — End: 2021-08-01
  Administered 2021-08-01: 4 mg via INTRAVENOUS

## 2021-08-01 MED ORDER — SODIUM CHLORIDE 0.9 % IV SOLN: INTRAVENOUS | Status: DC

## 2021-08-01 MED ORDER — PANCRELIPASE (LIP-PROT-AMYL) 40000-126000 UNITS PO CPEP: 2.0000 | ORAL_CAPSULE | Freq: Three times a day (TID) | ORAL | Status: DC

## 2021-08-01 MED ORDER — ACETAMINOPHEN 10 MG/ML IV SOLN
1000.0000 mg | Freq: Four times a day (QID) | INTRAVENOUS | Status: AC
  Administered 2021-08-01 – 2021-08-02 (×3): 1000 mg via INTRAVENOUS
  Filled 2021-08-01 (×4): qty 100

## 2021-08-01 MED ORDER — POLYETHYLENE GLYCOL 3350 17 GM/SCOOP PO POWD: 1.0000 | Freq: Once | ORAL | Status: DC

## 2021-08-01 MED ORDER — PROPOFOL 10 MG/ML IV BOLUS
INTRAVENOUS | Status: DC | PRN
  Administered 2021-08-01: 150 mg via INTRAVENOUS

## 2021-08-01 MED ORDER — OXYCODONE HCL 5 MG PO TABS
ORAL_TABLET | ORAL | Status: AC
  Filled 2021-08-01: qty 1

## 2021-08-01 MED ORDER — POLYVINYL ALCOHOL 1.4 % OP SOLN
1.0000 [drp] | Freq: Every day | OPHTHALMIC | Status: DC
  Administered 2021-08-02: 1 [drp] via OPHTHALMIC
  Filled 2021-08-01: qty 15

## 2021-08-01 MED ORDER — MIDAZOLAM HCL 2 MG/2ML IJ SOLN
INTRAMUSCULAR | Status: AC
  Filled 2021-08-01: qty 2

## 2021-08-01 MED ORDER — BUDESONIDE 3 MG PO CPEP
3.0000 mg | ORAL_CAPSULE | Freq: Every day | ORAL | Status: DC
  Administered 2021-08-02: 3 mg via ORAL
  Filled 2021-08-01: qty 1

## 2021-08-01 MED ORDER — LIDOCAINE 2% (20 MG/ML) 5 ML SYRINGE
INTRAMUSCULAR | Status: AC
  Filled 2021-08-01: qty 5

## 2021-08-01 MED ORDER — LIDOCAINE HCL 2 % IJ SOLN
INTRAMUSCULAR | Status: AC
  Filled 2021-08-01: qty 20

## 2021-08-01 MED ORDER — DEXAMETHASONE SODIUM PHOSPHATE 10 MG/ML IJ SOLN
INTRAMUSCULAR | Status: DC | PRN
  Administered 2021-08-01: 5 mg via INTRAVENOUS

## 2021-08-01 MED ORDER — CLONAZEPAM 1 MG PO TABS: 1.0000 mg | ORAL_TABLET | Freq: Every evening | ORAL | Status: DC | PRN

## 2021-08-01 MED ORDER — TRAMADOL HCL 50 MG PO TABS
50.0000 mg | ORAL_TABLET | Freq: Four times a day (QID) | ORAL | Status: DC | PRN
  Administered 2021-08-01 – 2021-08-02 (×2): 50 mg via ORAL
  Filled 2021-08-01 (×2): qty 1

## 2021-08-01 MED ORDER — FENTANYL CITRATE (PF) 100 MCG/2ML IJ SOLN
INTRAMUSCULAR | Status: AC
  Filled 2021-08-01: qty 2

## 2021-08-01 MED ORDER — HYDROMORPHONE HCL 1 MG/ML IJ SOLN
0.2500 mg | INTRAMUSCULAR | Status: DC | PRN
  Administered 2021-08-01 (×2): 0.5 mg via INTRAVENOUS

## 2021-08-01 MED ORDER — FENTANYL CITRATE (PF) 250 MCG/5ML IJ SOLN
INTRAMUSCULAR | Status: AC
  Filled 2021-08-01: qty 5

## 2021-08-01 MED ORDER — KETAMINE HCL 10 MG/ML IJ SOLN
INTRAMUSCULAR | Status: DC | PRN
  Administered 2021-08-01: 30 mg via INTRAVENOUS

## 2021-08-01 MED ORDER — DICYCLOMINE HCL 10 MG PO CAPS
10.0000 mg | ORAL_CAPSULE | Freq: Two times a day (BID) | ORAL | Status: DC
  Administered 2021-08-01 – 2021-08-02 (×2): 10 mg via ORAL
  Filled 2021-08-01 (×2): qty 1

## 2021-08-01 MED ORDER — PANCRELIPASE (LIP-PROT-AMYL) 12000-38000 UNITS PO CPEP
36000.0000 [IU] | ORAL_CAPSULE | Freq: Three times a day (TID) | ORAL | Status: DC
  Administered 2021-08-01 – 2021-08-02 (×2): 36000 [IU] via ORAL
  Filled 2021-08-01 (×2): qty 3

## 2021-08-01 MED ORDER — ACETAMINOPHEN 10 MG/ML IV SOLN
INTRAVENOUS | Status: DC | PRN
  Administered 2021-08-01: 1000 mg via INTRAVENOUS

## 2021-08-01 MED ORDER — ROCURONIUM BROMIDE 10 MG/ML (PF) SYRINGE
PREFILLED_SYRINGE | INTRAVENOUS | Status: DC | PRN
  Administered 2021-08-01 (×2): 30 mg via INTRAVENOUS
  Administered 2021-08-01: 70 mg via INTRAVENOUS

## 2021-08-01 MED ORDER — FENTANYL CITRATE (PF) 250 MCG/5ML IJ SOLN
INTRAMUSCULAR | Status: DC | PRN
Start: 2021-08-01 — End: 2021-08-01
  Administered 2021-08-01: 100 ug via INTRAVENOUS
  Administered 2021-08-01 (×3): 50 ug via INTRAVENOUS
  Administered 2021-08-01: 100 ug via INTRAVENOUS

## 2021-08-01 MED ORDER — LIDOCAINE 20MG/ML (2%) 15 ML SYRINGE OPTIME
INTRAMUSCULAR | Status: DC | PRN
  Administered 2021-08-01: 1.5 mg/kg/h via INTRAVENOUS

## 2021-08-01 MED ORDER — ONDANSETRON HCL 4 MG/2ML IJ SOLN
4.0000 mg | INTRAMUSCULAR | Status: DC | PRN
  Administered 2021-08-01: 4 mg via INTRAVENOUS
  Filled 2021-08-01: qty 2

## 2021-08-01 MED ORDER — BUPIVACAINE LIPOSOME 1.3 % IJ SUSP
INTRAMUSCULAR | Status: AC
  Filled 2021-08-01: qty 20

## 2021-08-01 MED ORDER — LACTATED RINGERS IV SOLN: INTRAVENOUS | Status: DC

## 2021-08-01 MED ORDER — SODIUM CHLORIDE 0.9% FLUSH: 3.0000 mL | INTRAVENOUS | Status: DC | PRN

## 2021-08-01 MED ORDER — SUGAMMADEX SODIUM 200 MG/2ML IV SOLN
INTRAVENOUS | Status: DC | PRN
  Administered 2021-08-01: 200 mg via INTRAVENOUS

## 2021-08-01 MED ORDER — CEFAZOLIN SODIUM-DEXTROSE 2-4 GM/100ML-% IV SOLN
2.0000 g | INTRAVENOUS | Status: AC
  Administered 2021-08-01: 2 g via INTRAVENOUS
  Filled 2021-08-01: qty 100

## 2021-08-01 MED ORDER — AZELASTINE HCL 0.1 % NA SOLN: 1.0000 | Freq: Two times a day (BID) | NASAL | Status: DC | PRN

## 2021-08-01 MED ORDER — ONDANSETRON HCL 4 MG/2ML IJ SOLN: 4.0000 mg | Freq: Once | INTRAMUSCULAR | Status: DC | PRN

## 2021-08-01 SURGICAL SUPPLY — 68 items
APPLICATOR ARISTA FLEXITIP XL (MISCELLANEOUS) ×1 IMPLANT
APPLICATOR SURGIFLO ENDO (HEMOSTASIS) ×2 IMPLANT
BAG COUNTER SPONGE SURGICOUNT (BAG) IMPLANT
CHLORAPREP W/TINT 26 (MISCELLANEOUS) ×2 IMPLANT
CLIP LIGATING HEM O LOK PURPLE (MISCELLANEOUS) ×2 IMPLANT
CLIP LIGATING HEMO LOK XL GOLD (MISCELLANEOUS) IMPLANT
CLIP LIGATING HEMO O LOK GREEN (MISCELLANEOUS) ×4 IMPLANT
COVER SURGICAL LIGHT HANDLE (MISCELLANEOUS) ×2 IMPLANT
COVER TIP SHEARS 8 DVNC (MISCELLANEOUS) ×1 IMPLANT
COVER TIP SHEARS 8MM DA VINCI (MISCELLANEOUS) ×2
CUTTER ECHEON FLEX ENDO 45 340 (ENDOMECHANICALS) IMPLANT
DECANTER SPIKE VIAL GLASS SM (MISCELLANEOUS) ×2 IMPLANT
DERMABOND ADVANCED (GAUZE/BANDAGES/DRESSINGS) ×1
DERMABOND ADVANCED .7 DNX12 (GAUZE/BANDAGES/DRESSINGS) ×1 IMPLANT
DRAIN CHANNEL 15F RND FF 3/16 (WOUND CARE) ×2 IMPLANT
DRAPE ARM DVNC X/XI (DISPOSABLE) ×4 IMPLANT
DRAPE COLUMN DVNC XI (DISPOSABLE) ×1 IMPLANT
DRAPE DA VINCI XI ARM (DISPOSABLE) ×8
DRAPE DA VINCI XI COLUMN (DISPOSABLE) ×2
DRAPE INCISE IOBAN 66X45 STRL (DRAPES) ×2 IMPLANT
DRAPE SHEET LG 3/4 BI-LAMINATE (DRAPES) ×2 IMPLANT
DRSG TEGADERM 8X12 (GAUZE/BANDAGES/DRESSINGS) ×1 IMPLANT
ELECT PENCIL ROCKER SW 15FT (MISCELLANEOUS) ×2 IMPLANT
ELECT REM PT RETURN 15FT ADLT (MISCELLANEOUS) ×2 IMPLANT
EVACUATOR SILICONE 100CC (DRAIN) ×2 IMPLANT
GLOVE SURG ENC MOIS LTX SZ6.5 (GLOVE) ×2 IMPLANT
GLOVE SURG ENC TEXT LTX SZ7.5 (GLOVE) ×4 IMPLANT
GOWN STRL REUS W/TWL LRG LVL3 (GOWN DISPOSABLE) ×2 IMPLANT
GOWN STRL REUS W/TWL XL LVL3 (GOWN DISPOSABLE) ×4 IMPLANT
HEMOSTAT ARISTA ABSORB 3G PWDR (HEMOSTASIS) IMPLANT
HOLDER FOLEY CATH W/STRAP (MISCELLANEOUS) IMPLANT
IRRIG SUCT STRYKERFLOW 2 WTIP (MISCELLANEOUS) ×2
IRRIGATION SUCT STRKRFLW 2 WTP (MISCELLANEOUS) ×1 IMPLANT
KIT BASIN OR (CUSTOM PROCEDURE TRAY) ×2 IMPLANT
KIT TURNOVER KIT A (KITS) IMPLANT
LOOP VESSEL MAXI BLUE (MISCELLANEOUS) ×2 IMPLANT
MARKER SKIN DUAL TIP RULER LAB (MISCELLANEOUS) ×2 IMPLANT
NDL INSUFFLATION 14GA 120MM (NEEDLE) IMPLANT
NEEDLE INSUFFLATION 14GA 120MM (NEEDLE) IMPLANT
NS IRRIG 1000ML POUR BTL (IV SOLUTION) ×2 IMPLANT
PAD POSITIONING PINK XL (MISCELLANEOUS) ×2 IMPLANT
POUCH SPECIMEN RETRIEVAL 10MM (ENDOMECHANICALS) ×2 IMPLANT
PROTECTOR NERVE ULNAR (MISCELLANEOUS) ×4 IMPLANT
RELOAD STAPLE 45 2.6 WHT THIN (STAPLE) IMPLANT
SEAL CANN UNIV 5-8 DVNC XI (MISCELLANEOUS) ×4 IMPLANT
SEAL XI 5MM-8MM UNIVERSAL (MISCELLANEOUS) ×8
SET TUBE SMOKE EVAC HIGH FLOW (TUBING) ×2 IMPLANT
SOLUTION ELECTROLUBE (MISCELLANEOUS) ×2 IMPLANT
STAPLE RELOAD 45 WHT (STAPLE) IMPLANT
STAPLE RELOAD 45MM WHITE (STAPLE)
SURGIFLO W/THROMBIN 8M KIT (HEMOSTASIS) ×2 IMPLANT
SUT ETHILON 3 0 PS 1 (SUTURE) ×2 IMPLANT
SUT MNCRL AB 4-0 PS2 18 (SUTURE) ×4 IMPLANT
SUT V-LOC BARB 180 2/0GR6 GS22 (SUTURE) ×2
SUT VIC AB 0 CT1 27 (SUTURE) ×2
SUT VIC AB 0 CT1 27XBRD ANTBC (SUTURE) ×1 IMPLANT
SUT VICRYL 0 UR6 27IN ABS (SUTURE) IMPLANT
SUT VLOC BARB 180 ABS3/0GR12 (SUTURE) ×2
SUTURE V-LC BRB 180 2/0GR6GS22 (SUTURE) ×1 IMPLANT
SUTURE VLOC BRB 180 ABS3/0GR12 (SUTURE) ×1 IMPLANT
TOWEL OR 17X26 10 PK STRL BLUE (TOWEL DISPOSABLE) ×2 IMPLANT
TOWEL OR NON WOVEN STRL DISP B (DISPOSABLE) ×2 IMPLANT
TRAY FOLEY MTR SLVR 16FR STAT (SET/KITS/TRAYS/PACK) ×2 IMPLANT
TRAY LAPAROSCOPIC (CUSTOM PROCEDURE TRAY) ×2 IMPLANT
TROCAR BLADELESS OPT 5 100 (ENDOMECHANICALS) ×1 IMPLANT
TROCAR ENDOPATH XCEL 12X100 BL (ENDOMECHANICALS) IMPLANT
TROCAR XCEL 12X100 BLDLESS (ENDOMECHANICALS) ×2 IMPLANT
WATER STERILE IRR 1000ML POUR (IV SOLUTION) ×2 IMPLANT

## 2021-08-01 NOTE — Plan of Care (Signed)
°  Problem: Education: Goal: Knowledge of the prescribed therapeutic regimen will improve Outcome: Progressing   Problem: Respiratory: Goal: Ability to achieve and maintain a regular respiratory rate will improve Outcome: Progressing   Problem: Education: Goal: Knowledge of General Education information will improve Description: Including pain rating scale, medication(s)/side effects and non-pharmacologic comfort measures Outcome: Progressing   Problem: Nutrition: Goal: Adequate nutrition will be maintained Outcome: Progressing

## 2021-08-01 NOTE — Anesthesia Procedure Notes (Signed)
Procedure Name: Intubation Date/Time: 08/01/2021 10:15 AM Performed by: Lollie Sails, CRNA Pre-anesthesia Checklist: Patient identified, Emergency Drugs available, Suction available, Patient being monitored and Timeout performed Patient Re-evaluated:Patient Re-evaluated prior to induction Oxygen Delivery Method: Circle system utilized Preoxygenation: Pre-oxygenation with 100% oxygen Induction Type: IV induction Ventilation: Mask ventilation without difficulty Laryngoscope Size: Miller and 3 Grade View: Grade I Tube type: Oral Tube size: 7.5 mm Number of attempts: 1 Airway Equipment and Method: Stylet Placement Confirmation: ETT inserted through vocal cords under direct vision, positive ETCO2 and breath sounds checked- equal and bilateral Secured at: 23 cm Tube secured with: Tape Dental Injury: Teeth and Oropharynx as per pre-operative assessment

## 2021-08-01 NOTE — Discharge Instructions (Signed)

## 2021-08-01 NOTE — H&P (Signed)
Renal Mass Surveillance  HPI: Lori [name] is a 61 year-old female established patient who is here ongoing eval and management of a renal mass.  The mass is on the right side.   The lesion(s) was first noted on approximately 04/22/2021. The mass was seen on CT Scan.   The mass was last imaged 6 weeks ago. The area was described as 74mm right posterior upper pole lesion.   Her symptoms include back pain. Patient denies having flank pain, groin pain, nausea, vomiting, fever, chills, and blood in urine. She has not seen blood in her urine. She does have a good appetite. She is not having pain in new locations. She has not recently had unwanted weight loss.   She has had previous abdominal surgery. Her past surgical history includes: Lap cholecystectomy in 1994. The patient can walk a flight of steps.   The patient denies history of diabetes, heart attack or stroke. There is not a a family history of kidney cancer. There is no family history of brain tumors (AMLs), seizures or brain aneurysm's.   Incidental finding of right renal mass, it is located on the posterior upper pole of the right kidney. It measures 1 cm x 1.3 cm. The hilum appears to be simple with 1 artery and 1 vein.     ALLERGIES: Gabapentin    MEDICATIONS: Omeprazole 40 mg capsule,delayed release  Azelastine Hcl  Calcium  Clonazepam  Coq10  Dicyclomine Hcl 10 mg capsule  Excedrin Extra Strength  Ezetimibe 10 mg tablet  Florastor 250 mg capsule  Krill Oil  Levothyroxine 50 mcg capsule  Lumigan 0.01 % drops  Multivitamin  Ondansetron Hcl 4 mg tablet  Osteo Bi-Flex 1,500 mg-400 unit-100 mg tablet  Preservision Areds     GU PSH: Locm 300-399Mg /Ml Iodine,1Ml - 05/01/2021       PSH Notes: O lift procedure, laparoscopic cholecystectomy, pin in left arm, bilateral knee replacements. 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  Gastrointestinal (Upper):   Patient denies nausea, vomiting, and indigestion/ heartburn.  Gastrointestinal (Lower):   Patient denies diarrhea and constipation.  Constitutional:   Patient denies fever, night sweats, weight loss, and fatigue.  Skin:   Patient denies skin rash/ lesion and itching.  Eyes:   Patient denies blurred vision and double vision.  Ears/ Nose/ Throat:   Patient denies sore throat and sinus problems.  Hematologic/Lymphatic:   Patient denies swollen glands and easy bruising.  Cardiovascular:   Patient denies leg swelling and chest pains.  Respiratory:   Patient denies cough and shortness of breath.  Endocrine:   Patient denies excessive thirst.  Musculoskeletal:   Patient denies back pain and joint pain.  Neurological:   Patient denies headaches and dizziness.  Psychologic:   Patient denies depression and anxiety.   VITAL SIGNS:      06/02/2021 03:31 PM  Weight 165 lb / 74.84 kg  Height 66 in / 167.64 cm  BP 108/67 mmHg  Pulse  85 /min  BMI 26.6 kg/m   MULTI-SYSTEM PHYSICAL EXAMINATION:    Constitutional: Well-nourished. No physical deformities. Normally developed. Good grooming.  Respiratory: Normal breath sounds. No labored breathing, no use of accessory muscles.   Cardiovascular: Regular rate and rhythm. No murmur, no gallop. Normal temperature, normal extremity pulses, no swelling, no varicosities.      Complexity of Data:  Source Of History:  Patient  Records Review:   Previous Doctor Records, Previous Hospital Records, Previous Patient Records, POC Tool  Urine Test Review:   Urinalysis   X-Ray Review: C.T. Abdomen/Pelvis: Reviewed Films. Discussed With Patient.     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 I went over the robotic-assisted laparoscopic partial nephrectomy approach. I described for the patient the procedure in detail including port placement. I detailed the postoperative course including the fact that the patient would have both a drain and a Foley catheter following the surgery. I told the patient that most often patients are discharged on postoperative day one or 2. I then detailed the expected recovery time, I told the patient that he would not be able to lift anything greater than 20 pounds for 4 weeks. I also went over the risks and benefits of this operation in great detail. We discussed the risk of injury to surrounding structures, major blood vessels and nerves, bleeding, infection, loss of kidney, and the risk of recurrent cancer.

## 2021-08-01 NOTE — Transfer of Care (Signed)
Immediate Anesthesia Transfer of Care Note  Patient: Lori [name]  Procedure(s) Performed: XI ROBOTIC ASSITED RIGHT PARTIAL NEPHRECTOMY (Right)  Patient Location: PACU  Anesthesia Type:General  Level of Consciousness: awake, drowsy, patient cooperative and responds to stimulation  Airway & Oxygen Therapy: Patient Spontanous Breathing and Patient connected to face mask oxygen  Post-op Assessment: Report given to RN and Post -op Vital signs reviewed and stable  Post vital signs: Reviewed and stable  Last Vitals:  Vitals Value Taken Time  BP 141/73 08/01/21 1309  Temp    Pulse 106 08/01/21 1311  Resp 25 08/01/21 1311  SpO2 100 % 08/01/21 1311  Vitals shown include unvalidated device data.  Last Pain:  Vitals:   08/01/21 0828  TempSrc:   PainSc: 0-No pain         Complications: No notable events documented.

## 2021-08-01 NOTE — Anesthesia Postprocedure Evaluation (Signed)
Anesthesia Post Note  Patient: Lori [name]  Procedure(s) Performed: XI ROBOTIC ASSITED RIGHT PARTIAL NEPHRECTOMY (Right)     Patient location during evaluation: PACU Anesthesia Type: General Level of consciousness: awake and alert Pain management: pain level controlled Vital Signs Assessment: post-procedure vital signs reviewed and stable Respiratory status: spontaneous breathing, nonlabored ventilation, respiratory function stable and patient connected to nasal cannula oxygen Cardiovascular status: blood pressure returned to baseline and stable Postop Assessment: no apparent nausea or vomiting Anesthetic complications: no   No notable events documented.  Last Vitals:  Vitals:   08/01/21 1415 08/01/21 1449  BP: 129/68 139/69  Pulse: 88 91  Resp: 11 16  Temp:  36.5 C  SpO2: 99% 97%    Last Pain:  Vitals:   08/01/21 1449  TempSrc: Oral  PainSc: 2                  Barnet Glasgow

## 2021-08-01 NOTE — Interval H&P Note (Signed)
History and Physical Interval Note:  08/01/2021 9:57 AM  Lori [name]  has presented today for surgery, with the diagnosis of RIGHT RENAL MASS.  The various methods of treatment have been discussed with the patient and family. After consideration of risks, benefits and other options for treatment, the patient has consented to  Procedure(s): XI ROBOTIC ASSITED RIGHT PARTIAL NEPHRECTOMY (Right) as a surgical intervention.  The patient's history has been reviewed, patient examined, no change in status, stable for surgery.  I have reviewed the patient's chart and labs.  Questions were answered to the patient's satisfaction.     [name]

## 2021-08-01 NOTE — Plan of Care (Signed)
°  Problem: Education: Goal: Knowledge of the prescribed therapeutic regimen will improve Outcome: Progressing   Problem: Respiratory: Goal: Ability to achieve and maintain a regular respiratory rate will improve Outcome: Progressing   Problem: Education: Goal: Knowledge of General Education information will improve Description: Including pain rating scale, medication(s)/side effects and non-pharmacologic comfort measures Outcome: Progressing   Problem: Coping: Goal: Level of anxiety will decrease Outcome: Progressing   Problem: Elimination: Goal: Will not experience complications related to urinary retention Outcome: Progressing   Problem: Pain Managment: Goal: General experience of comfort will improve Outcome: Progressing   Problem: Safety: Goal: Ability to remain free from injury will improve Outcome: Progressing   Problem: Skin Integrity: Goal: Risk for impaired skin integrity will decrease Outcome: Progressing

## 2021-08-01 NOTE — Op Note (Signed)
Preoperative diagnosis:  right renal mass   Postoperative diagnosis:  same   Procedure: Robotic assisted laparoscopic right partial nephrectomy  Surgeon: [name], MD 1st assistant: Darl Pikes, MD  Anesthesia: General  Complications: None  Intraoperative findings:  #1. Small posterior upperpole tumor #2. Warm ischemia time 12 min  EBL: 150cc  Specimens: right renal mass   Indication:  [name] is a 61 y.o. patient with right renal mass.  After reviewing the management options for treatment, he elected to proceed with the above surgical procedure(s). We have discussed the potential benefits and risks of the procedure, side effects of the proposed treatment, the likelihood of the patient achieving the goals of the procedure, and any potential problems that might occur during the procedure or recuperation. Informed consent has been obtained.   Description of procedure:  An assistant was required for this surgical procedure.  The duties of the assistant included but were not limited to suctioning, passing suture, camera manipulation, retraction. This procedure would not be able to be performed without an Environmental consultant.  The patient was taken to the operating room and a general anesthetic was administered. The patient was given preoperative antibiotics, placed in the right modified flank position with care to pad all potential pressure points, and prepped and draped in the usual sterile fashion. Next a preoperative timeout was performed.  A site was selected on the right side of the umbilicus for placement of the camera port. This was placed using a standard modified Hassan technique with entry into the peritoneum with a 10 mm 0 deg laparoscope with a visual obturator. We entered the peritoneum without incident and established pneumoperitoneum.  The camera was then used to inspect the abdomen and there was no evidence of any intra-abdominal injuries or other  abnormalities. The remaining abdominal ports were then placed. 8 mm robotic ports were placed in the right upper quadrant, right lower quadrant, and far right lateral abdominal wall. A 12 mm port was placed in the upper midline for laparoscopic assistance. Lysis of adhesions were performed at the midline to allow placement of the 12 mm assistant port. All ports were placed under direct vision without difficulty. The surgical cart was then docked.   Utilizing the cautery scissors, the white line of Toldt was incised allowing the colon to be mobilized medially and the plane between the mesocolon and the anterior layer of Gerotas fascia to be developed and the kidney to be exposed.  The ureter and gonadal vein were identified inferiorly and the ureter was lifted anteriorly off the psoas muscle.  Dissection proceeded superiorly along the gonadal vein until the renal vein was identified.  The renal hilum was then carefully isolated with a combination of blunt and sharp dissection allowing the renal arterial and venous structures to be separated and isolated in preparation for renal hilar vessel clamping.  Attention turned to the kidney and the perinephric fat surrounding the renal mass was removed and the kidney was mobilized sufficiently for exposure and resection of the renal mass.   Once the renal mass was properly isolated, preparations were made for resection of the tumor.    The renal artery was then clamped with a bulldog clamp.  The tumor was then excised with cold scissor dissection along with an adequate visible gross margin of normal renal parenchyma. The tumor appeared to be excised without any gross violation of the tumor. The renal collecting system was not entered during removal of the tumor.  A running  3-0 V-lock suture was then brought through the capsule of the kidney and run along the base of the renal defect to provide hemostasis and close any entry into the renal collecting system if present.  Weck clips were used to secure this suture outside the renal capsule at the proximal and distal ends. The bulldog clamps were then removed from the renal hilar vessel. A running 2-0 V lock suture was then used to close the capsule of the kidney using a sliding clip technique which resulted in excellent hemostasis. An additional hemostatic agent (Surgiflo) was then placed into the renal defect. Surgicel was then placed over the defect.    Total warm renal ischemia time was  12 minutes. The renal tumor resection site was examined. Hemostasis appeared adequate.   The kidney was placed back into its normal anatomic position and covered with perinephric fat as needed.  A # 35 Blake drain was then brought through the lateral lower port site and positioned in the perinephric space.  It was secured to the skin with a nylon suture. The surgical robotic cart was undocked.  The renal tumor specimen was removed intact within an endopouch retrieval bag via the camera port sites.  The camera port site and the other 12 mm port site were then closed at the fascial level with 0-vicryl suture.  All other laparoscopic/robotic ports were removed under direct vision and the pneumoperitoneum let down with inspection of the operative field performed and hemostasis again confirmed. All incision sites were then injected with local anesthetic and reapproximated at the skin level with 4-0 monocryl subcuticular closures. Dermabond was applied to the skin.  The patient tolerated the procedure well and without complications.  The patient was able to be extubated and transferred to the recovery unit in satisfactory condition.  [name], M.D.

## 2021-08-02 ENCOUNTER — Encounter (HOSPITAL_COMMUNITY): Payer: Self-pay | Admitting: Urology

## 2021-08-02 DIAGNOSIS — D3001 Benign neoplasm of right kidney: Secondary | ICD-10-CM | POA: Diagnosis not present

## 2021-08-02 LAB — BASIC METABOLIC PANEL
Anion gap: 9 (ref 5–15)
BUN: 11 mg/dL (ref 8–23)
CO2: 27 mmol/L (ref 22–32)
Calcium: 9.2 mg/dL (ref 8.9–10.3)
Chloride: 104 mmol/L (ref 98–111)
Creatinine, Ser: 0.71 mg/dL (ref 0.44–1.00)
GFR, Estimated: >60 mL/min (ref >60)
Glucose, Bld: 90 mg/dL (ref 70–99)
Potassium: 4 mmol/L (ref 3.5–5.1)
Sodium: 140 mmol/L (ref 135–145)

## 2021-08-02 LAB — HEMOGLOBIN AND HEMATOCRIT, BLOOD
HCT: 38.4 % (ref 36.0–46.0)
Hemoglobin: 12.8 g/dL (ref 12.0–15.0)

## 2021-08-02 MED ORDER — ORAL CARE MOUTH RINSE
15.0000 mL | Freq: Two times a day (BID) | OROMUCOSAL | Status: DC
  Administered 2021-08-02: 15 mL via OROMUCOSAL

## 2021-08-02 NOTE — Progress Notes (Signed)
Patient has tolerated a regular diet for breakfast, ambulated approximately 180 feet and tolerated well, also has voided 200 ml light amber urine. Eulas Post, RN

## 2021-08-02 NOTE — Discharge Summary (Signed)
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   Hypercholesterolemia 09/16/2020   Hypothyroidism 09/16/2020   Insomnia 09/16/2020   Left upper quadrant pain 09/16/2020   Menopausal symptom 09/16/2020   Osteoarthritis 09/16/2020   Other specified abnormal findings of blood chemistry 09/16/2020   Overweight with body mass index (BMI) 25.0-29.9 09/16/2020   Lumbar radiculopathy 05/24/2020   Lumbar spondylosis 02/22/2020   Low back pain 11/22/2019   History of total knee replacement, right 05/09/2018   Hand pain 08/31/2017   Hip pain 08/31/2017   History of total knee replacement, left 07/31/2016   H/O total knee replacement 07/26/2015    Procedures performed: Procedure(s): XI ROBOTIC ASSITED RIGHT PARTIAL NEPHRECTOMY  History and Physical: For full details, please see admission history and physical. Briefly, Lori [name] is a 61 y.o. year old patient with right renal mass.   PE on day of discharge: NAD Vitals:   08/01/21 1859 08/01/21 2244 08/01/21 2246 08/02/21 0437  BP: 124/66  114/67 125/66  Pulse: 80 72 70 63  Resp: _0 Temp: 97.8 F (36.6 C) 97.7 F (36.5 C) 97.9 F (36.6 C) 98.2 F (36.8 C)  TempSrc: Oral Oral Oral Oral  SpO2: 99% 97% 98% 98%  Weight:      Height:        Intake/Output Summary (Last 24 hours) at 08/02/2021 0751 Last data filed at 08/02/2021 0600 Gross per 24 hour  Intake 4972.53 ml  Output 2475 ml  Net 2497.53 ml   Non labored breathing Abdomen is  soft.  Incisions c/d/I Ext symmetric Foley out  Hospital Course: Patient tolerated the procedure well.  She was then transferred to the floor after an uneventful PACU stay.  Her hospital course was uncomplicated.  On POD#1 she had met discharge criteria: was eating a regular diet, was up and ambulating independently,  pain was well controlled, was voiding without a catheter, and was ready to for discharge.   Laboratory values:  Recent Labs    08/01/21 1324 08/02/21 0538  HGB 13.5 12.8  HCT 40.9 38.4   Recent Labs    08/01/21 1324 08/02/21 0538  NA 136 140  K 4.1 4.0  CL 104 104  CO2 22 27  GLUCOSE 140* 90  BUN 14 11  CREATININE 0.82 0.71  CALCIUM 9.1 9.2   No results for input(s): LABPT, INR in the last 72 hours. No results for input(s): LABURIN in the last 72 hours. Results for orders placed or performed during the hospital encounter of 07/30/21  SARS CORONAVIRUS 2 (TAT 6-24 HRS) Nasopharyngeal Nasopharyngeal Swab     Status: None   Collection Time: 07/30/21  7:34 AM   Specimen: Nasopharyngeal Swab  Result Value Ref Range Status   SARS Coronavirus 2 NEGATIVE NEGATIVE Final    Comment: (NOTE) SARS-CoV-2 target nucleic acids are NOT DETECTED.  The SARS-CoV-2 RNA is generally detectable in upper and lower respiratory specimens during the acute phase of infection. Negative results do not preclude SARS-CoV-2 infection, do not rule out co-infections with  other pathogens, and should not be used as the sole basis for treatment or other patient management decisions. Negative results must be combined with clinical observations, patient history, and epidemiological information. The expected result is Negative.  Fact Sheet for Patients: SugarRoll.be  Fact Sheet for Healthcare Providers: https://www.woods-mathews.com/  This test is not yet approved or cleared by the [place] FDA and  has been authorized for detection and/or  diagnosis of SARS-CoV-2 by FDA under an Emergency Use Authorization (EUA). This EUA will remain  in effect (meaning this test can be used) for the duration of the COVID-19 declaration under Se ction 564(b)(1) of the Act, 21 U.S.C. section 360bbb-3(b)(1), unless the authorization is terminated or revoked sooner.  Performed at [facility], [place] [address], [place], [place] [address]     Disposition: Home  Discharge instruction: The patient was instructed to be ambulatory but told to refrain from heavy lifting, strenuous activity, or driving.   Discharge medications:  Allergies as of 08/02/2021       Reactions   Other Nausea And Vomiting, Other (See Comments)   Strong pain medications must be taken with nausea medicine  PRE-DIABETIC CONTROLLED THROUGH DIET   Codeine Nausea And Vomiting   Gabapentin Other (See Comments)   Red/dry eyes   Celebrex [celecoxib] Other (See Comments)   Gives her a 'really bad headache'        Medication List     TAKE these medications    acetaminophen 650 MG CR tablet Commonly known as: TYLENOL Take 650 mg by mouth every 8 (eight) hours as needed for pain.   aspirin-acetaminophen-caffeine 250-250-65 MG tablet Commonly known as: EXCEDRIN MIGRAINE Take 2 tablets by mouth daily as needed for headache.   azelastine 0.1 % nasal spray Commonly known as: ASTELIN Place 1 spray into both nostrils 2 (two) times daily as needed for rhinitis. Use in each nostril as directed   bimatoprost 0.01 % Soln Commonly known as: LUMIGAN Place 1 drop into both eyes at bedtime.   budesonide 3 MG 24 hr capsule Commonly known as: ENTOCORT EC Take 3 mg by mouth daily.   CALCIUM + D PO Take 2 tablets by mouth 2 (two) times daily.   clonazePAM 1 MG tablet Commonly known as: KLONOPIN Take 1 mg by mouth at bedtime as needed (restless leg).   CoQ-10 100 MG Caps Take 100 mg by mouth daily.   dicyclomine 10 MG capsule Commonly known as: BENTYL Take 10 mg  by mouth 2 (two) times daily.   ezetimibe 10 MG tablet Commonly known as: ZETIA Take 10 mg by mouth daily.   Krill Oil 500 MG Caps Take 500 mg by mouth daily.   levothyroxine 50 MCG tablet Commonly known as: SYNTHROID Take 50 mcg by mouth daily before breakfast.   montelukast 10 MG tablet Commonly known as: SINGULAIR Take 10 mg by mouth at bedtime.   multivitamin with minerals Tabs tablet Take 1 tablet by mouth daily.   mupirocin ointment 2 % Commonly known as: BACTROBAN 1 application 2 (two) times daily. Up to 3 times daily for place removed from left forearm   nystatin cream Commonly known as: MYCOSTATIN Apply 1 application topically 2 (two) times daily as needed for itching. Mixed with triamcinolone   omeprazole 40 MG capsule Commonly known as: PRILOSEC Take 40 mg by mouth daily.   ondansetron 4 MG tablet Commonly known as: Zofran Take 1 tablet (4 mg total) by mouth every 8 (eight) hours as needed  for nausea or vomiting.   PreserVision AREDS 2 Caps Take 1 capsule by mouth 2 (two) times daily.   saccharomyces boulardii 250 MG capsule Commonly known as: FLORASTOR Take 250 mg by mouth 2 (two) times daily.   Selenium 200 MCG Caps Take 200 mcg by mouth daily.   SYSTANE OP Place 1 drop into both eyes daily.   traMADol 50 MG tablet Commonly known as: ULTRAM Take 2 tablets (100 mg total) by mouth every 6 (six) hours as needed for severe pain.   triamcinolone cream 0.1 % Commonly known as: KENALOG Apply 1 application topically 2 (two) times daily as needed for itching. Mix with nystatin cream   Zenpep 40000-126000 units Cpep Generic drug: Pancrelipase (Lip-Prot-Amyl) Take 2 capsules by mouth with breakfast, with lunch, and with evening meal.        Followup:   Follow-up Information     Hollace Hayward, NP Follow up on 08/15/2021.   Why: 10:30am Contact information: [place]. [place] [address] [phone]

## 2021-08-02 NOTE — Progress Notes (Signed)
Foley Catheter was removed at 0547. Patient was only able to get up and walk once during the nightshift due to feeling lightheaded. Patient was able to walk from patient's room up and down half of [place] hall once. Patient tolerated ambulation fairly well.

## 2021-08-04 LAB — SURGICAL PATHOLOGY

## 2021-10-17 ENCOUNTER — Other Ambulatory Visit: Payer: Self-pay | Admitting: Gastroenterology

## 2021-10-17 ENCOUNTER — Other Ambulatory Visit (HOSPITAL_COMMUNITY): Payer: Self-pay | Admitting: Gastroenterology

## 2021-10-17 DIAGNOSIS — R7989 Other specified abnormal findings of blood chemistry: Secondary | ICD-10-CM

## 2021-10-29 ENCOUNTER — Other Ambulatory Visit: Payer: Self-pay | Admitting: Radiology

## 2021-10-29 ENCOUNTER — Other Ambulatory Visit: Payer: Self-pay | Admitting: Student

## 2021-10-30 ENCOUNTER — Encounter (HOSPITAL_COMMUNITY): Payer: Self-pay

## 2021-10-30 ENCOUNTER — Ambulatory Visit (HOSPITAL_COMMUNITY)
Admission: RE | Admit: 2021-10-30 | Discharge: 2021-10-30 | Disposition: A | Discharge status: Home / Self Care | Payer: 59 | Source: Ambulatory Visit | Attending: Gastroenterology | Admitting: Gastroenterology

## 2021-10-30 DIAGNOSIS — K859 Acute pancreatitis without necrosis or infection, unspecified: Secondary | ICD-10-CM | POA: Diagnosis not present

## 2021-10-30 DIAGNOSIS — R7989 Other specified abnormal findings of blood chemistry: Secondary | ICD-10-CM | POA: Diagnosis present

## 2021-10-30 DIAGNOSIS — R7303 Prediabetes: Secondary | ICD-10-CM | POA: Diagnosis not present

## 2021-10-30 DIAGNOSIS — E039 Hypothyroidism, unspecified: Secondary | ICD-10-CM | POA: Insufficient documentation

## 2021-10-30 LAB — PROTIME-INR
INR: 1 (ref 0.8–1.2)
Prothrombin Time: 13.2 seconds (ref 11.4–15.2)

## 2021-10-30 LAB — CBC
HCT: 45.5 % (ref 36.0–46.0)
Hemoglobin: 14.8 g/dL (ref 12.0–15.0)
MCH: 30.3 pg (ref 26.0–34.0)
MCHC: 32.5 g/dL (ref 30.0–36.0)
MCV: 93 fL (ref 80.0–100.0)
Platelets: 211 10*3/uL (ref 150–400)
RBC: 4.89 MIL/uL (ref 3.87–5.11)
RDW: 13.6 % (ref 11.5–15.5)
WBC: 5.8 10*3/uL (ref 4.0–10.5)
nRBC: 0 % (ref 0.0–0.2)

## 2021-10-30 MED ORDER — MIDAZOLAM HCL 2 MG/2ML IJ SOLN
INTRAMUSCULAR | Status: AC | PRN
  Administered 2021-10-30: 1 mg via INTRAVENOUS

## 2021-10-30 MED ORDER — ONDANSETRON HCL 4 MG/2ML IJ SOLN: 4.0000 mg | Freq: Four times a day (QID) | INTRAMUSCULAR | Status: DC | PRN

## 2021-10-30 MED ORDER — MIDAZOLAM HCL 2 MG/2ML IJ SOLN
INTRAMUSCULAR | Status: AC
  Filled 2021-10-30: qty 2

## 2021-10-30 MED ORDER — LIDOCAINE HCL (PF) 1 % IJ SOLN
INTRAMUSCULAR | Status: AC
  Filled 2021-10-30: qty 30

## 2021-10-30 MED ORDER — SODIUM CHLORIDE 0.9 % IV SOLN: INTRAVENOUS | Status: DC

## 2021-10-30 MED ORDER — FENTANYL CITRATE (PF) 100 MCG/2ML IJ SOLN
INTRAMUSCULAR | Status: AC
  Filled 2021-10-30: qty 2

## 2021-10-30 MED ORDER — ONDANSETRON HCL 4 MG/2ML IJ SOLN
INTRAMUSCULAR | Status: AC
  Administered 2021-10-30: 4 mg
  Filled 2021-10-30: qty 2

## 2021-10-30 MED ORDER — GELATIN ABSORBABLE 12-7 MM EX MISC
CUTANEOUS | Status: AC
  Filled 2021-10-30: qty 1

## 2021-10-30 MED ORDER — FENTANYL CITRATE (PF) 100 MCG/2ML IJ SOLN
INTRAMUSCULAR | Status: AC | PRN
  Administered 2021-10-30: 25 ug via INTRAVENOUS

## 2021-10-30 NOTE — Procedures (Signed)
Interventional Radiology Procedure Note  Procedure: US guided biopsy of liver, medical liver Complications: None EBL: None Recommendations: - Bedrest 2 hours.   - Routine wound care - Follow up pathology - Advance diet   Signed,  Miles Borkowski, DO   

## 2021-10-30 NOTE — H&P (Addendum)
? ?Chief Complaint: ?Patient was seen in consultation today for image guided random liver bx at the request of Outlaw,William ? ?Referring Physician(s): Outlaw,William ? ?Supervising Physician: Michaelle Birks ? ?Patient Status: [facility] - Out-pt ? ?History of Present Illness: ?Lori [name] is a 61 y.o. female with PMHs of hypothyroidism, pre diabetes, pancreatitis, and LFT who has been followed by GI.  ? ?Patient underwent MR and MRCP on 08/01/21 which showed:  ? ?Signs of peripancreatic fat necrosis with hemorrhagic or ?proteinaceous features, no current evidence of acute pancreatitis. ?  ?No choledocholithiasis or biliary duct dilation following ?cholecystectomy. No signs of pancreatic duct dilation or evidence of ?pancreatic divisum. ?  ?Small solid enhancing right renal lesion suspicious for small renal ?neoplasm such as renal cell carcinoma. Consider urologic ?consultation for further evaluation. ? ?Of note, Patient underwent partial right nephrectomy on 08/01/21, pathology showed angiomyolipoma.  ? ?IR was requested for image guided random liver biopsy by GI fir further evaluation and management.  ? ?Patient laying in bed, not in acute distress.  ?Denise headache, fever, chills, shortness of breath, cough, chest pain, abdominal pain, nausea ,vomiting, and bleeding. ?States that she get nauseous very easily with pain medications, has been requiring antiemesis medications for previous procedures.  ?IV 4g Zofran PRN  q 6 hrs ordered, IR team notified.  ?Patient states that she does something similar to water aerobic at [facility], asks when she can do it after the procedure.  ?Informed the patient the she should not submerge the puncture site for 7 days.  ?Patient verbalized understanding.  ? ? ?Past Medical History:  ?Diagnosis Date  ? Arthritis   ? Colitis   ? Costochondritis   ? Elevated liver enzymes   ? GERD (gastroesophageal reflux disease)   ? Hand pain   ? Headache   ? Hip pain   ? Hypothyroidism   ? Low back pain    ? Lumbar radiculopathy   ? Lumbar spondylosis   ? Ocular hypertension, bilateral   ? Pre-diabetes   ? controlled by diet and exercise  ? Restless leg syndrome   ? Seasonal allergies   ? ? ?Past Surgical History:  ?Procedure Laterality Date  ? ABDOMINAL EXPOSURE N/A 09/25/2020  ? Procedure: ABDOMINAL EXPOSURE;  Surgeon: Rosetta Posner, MD;  Location: [facility] OR;  Service: Vascular;  Laterality: N/A;  ? ANTERIOR AND POSTERIOR SPINAL FUSION N/A 09/25/2020  ? Procedure: OBLIQUE LUMBAR INTERBODY FUSION (OLIF)  LUMBAR FOUR THROUGH FIVE, POSTERIOR SPINAL FUSION INTERBODY;  Surgeon: Melina Schools, MD;  Location: [facility];  Service: Orthopedics;  Laterality: N/A;  4 hrs ?Dr. Donnetta Hutching to do approach ?Tap block with exparel  ? broken arm  1967  ? left  ? CATARACT EXTRACTION    ? CHOLECYSTECTOMY    ? DILATION AND CURETTAGE OF UTERUS    ? KNEE ARTHROSCOPY Left   ? ROBOTIC ASSITED PARTIAL NEPHRECTOMY Right 08/01/2021  ? Procedure: XI ROBOTIC ASSITED RIGHT PARTIAL NEPHRECTOMY;  Surgeon: [name], MD;  Location: [facility];  Service: Urology;  Laterality: Right;  ? TOTAL KNEE ARTHROPLASTY Left 07/26/2015  ? TOTAL KNEE ARTHROPLASTY Left 07/26/2015  ? Procedure: LEFT TOTAL KNEE ARTHROPLASTY;  Surgeon: Netta Cedars, MD;  Location: [facility];  Service: Orthopedics;  Laterality: Left;  ? TOTAL KNEE ARTHROPLASTY Right 07/31/2016  ? Procedure: TOTAL KNEE ARTHROPLASTY;  Surgeon: Netta Cedars, MD;  Location: [facility];  Service: Orthopedics;  Laterality: Right;  ? WISDOM TOOTH EXTRACTION    ? ? ?Allergies: ?Other, Codeine, Gabapentin,  and Celebrex [celecoxib] ? ?Medications: ?Prior to Admission medications   ?Medication Sig Start Date End Date Taking? Authorizing Provider  ?acetaminophen (TYLENOL) 650 MG CR tablet Take 650 mg by mouth every 8 (eight) hours as needed for pain.   Yes [provider]  ?aspirin-acetaminophen-caffeine (EXCEDRIN MIGRAINE) [phone] MG tablet Take 2 tablets by mouth daily as needed for headache.   Yes [provider]  ?azelastine (ASTELIN) 0.1 % nasal spray Place 1 spray into both nostrils 2 (two) times daily as needed for rhinitis. Use in each nostril as directed   Yes [provider]  ?bimatoprost (LUMIGAN) 0.01 % SOLN Place 1 drop into both eyes at bedtime.   Yes [provider]  ?budesonide (ENTOCORT EC) 3 MG 24 hr capsule Take 3 mg by mouth daily. 10/25/19  Yes [provider]  ?Calcium Citrate-Vitamin D (CALCIUM + D PO) Take 2 tablets by mouth 2 (two) times daily.   Yes [provider]  ?clonazePAM (KLONOPIN) 1 MG tablet Take 1 mg by mouth at bedtime as needed (restless leg).   Yes [provider]  ?Coenzyme Q10 (COQ-10) 100 MG CAPS Take 100 mg by mouth daily.   Yes [provider]  ?dicyclomine (BENTYL) 10 MG capsule Take 10 mg by mouth 3 (three) times daily before meals. 07/26/20  Yes [provider]  ?ezetimibe (ZETIA) 10 MG tablet Take 10 mg by mouth daily.   Yes [provider]  ?Astrid Drafts 500 MG CAPS Take 500 mg by mouth daily.   Yes [provider]  ?levothyroxine (SYNTHROID) 50 MCG tablet Take 50 mcg by mouth daily before breakfast.   Yes [provider]  ?montelukast (SINGULAIR) 10 MG tablet Take 10 mg by mouth at bedtime.   Yes [provider]  ?Multiple Vitamin (MULTIVITAMIN WITH MINERALS) TABS tablet Take 1 tablet by mouth daily.   Yes [provider]  ?Multiple Vitamins-Minerals (PRESERVISION AREDS 2) CAPS Take 1 capsule by mouth 2 (two) times daily.   Yes [provider]  ?nystatin cream (MYCOSTATIN) Apply 1 application topically 2 (two) times daily as needed for itching. Mixed with triamcinolone 01/16/21  Yes [provider]  ?omeprazole (PRILOSEC) 40 MG capsule Take 40 mg by mouth daily. 05/20/17  Yes [provider]  ?Pancrelipase, Lip-Prot-Amyl, (ZENPEP) 40000-126000 units CPEP Take 2 capsules by mouth with breakfast, with lunch, and with evening meal.   Yes  [provider]  ?Polyethyl Glycol-Propyl Glycol (SYSTANE OP) Place 1 drop into both eyes daily.   Yes [provider]  ?saccharomyces boulardii (FLORASTOR) 250 MG capsule Take 250 mg by mouth 2 (two) times daily.   Yes [provider]  ?Selenium 200 MCG CAPS Take 200 mcg by mouth daily.   Yes [provider]  ?ondansetron (ZOFRAN) 4 MG tablet Take 1 tablet (4 mg total) by mouth every 8 (eight) hours as needed for nausea or vomiting. 08/01/21   [name], MD  ?triamcinolone cream (KENALOG) 0.1 % Apply 1 application topically 2 (two) times daily as needed for itching. Mix with nystatin cream 01/16/21   [provider]  ?  ? ?Family History  ?Problem Relation Age of Onset  ? Cancer Mother   ? Congestive Heart Failure Mother   ? Arthritis Mother   ? Cancer Father   ? Arthritis Father   ? Diabetes Mellitus I Paternal Grandmother   ? ? ?Social History  ? ?Socioeconomic History  ? Marital status: Married  ?  Spouse name: Not on file  ? Number of children: Not on file  ? Years of education: Not on file  ? Highest education level: Not on file  ?Occupational History  ? Not on file  ?Tobacco Use  ? Smoking status: Never  ? Smokeless tobacco: Never  ?Vaping Use  ? Vaping Use: Never used  ?Substance and Sexual Activity  ? Alcohol use: No  ?  Comment: stopped  ? Drug use: No  ? Sexual activity: Not Currently  ?  Birth control/protection: None  ?Other Topics Concern  ? Not on file  ?Social History Narrative  ? Not on file  ? ?Social Determinants of Health  ? ?Financial Resource Strain: Not on file  ?Food Insecurity: Not on file  ?Transportation Needs: Not on file  ?Physical Activity: Not on file  ?Stress: Not on file  ?Social Connections: Not on file  ? ? ? ?Review of Systems: A 12 point ROS discussed and pertinent positives are indicated in the HPI above.  All other systems are negative. ? ?Vital Signs: ?BP 118/70   Pulse 70   Temp 98.5 ?F (36.9 ?C) (Oral)   Resp 17   Ht  '5\' 7"'$  (1.702 m)   Wt 169 lb (76.7 kg)   SpO2 97%   BMI 26.47 kg/m?  ? ?Physical Exam ?Vitals and nursing note reviewed.  ?Constitutional:   ?   General: Patient is not in acute distress. ?   Appearance: Normal appearanc

## 2021-11-05 LAB — SURGICAL PATHOLOGY

## 2022-07-23 IMAGING — CR DG OR LOCAL ABDOMEN
1 series · 1 of 1 positions shown · non-contrast
Comparison: CT Abdomen and Pelvis 08/25/2019.

CLINICAL DATA: 60-year-old female undergoing lumbar surgery. Query
unintentional retained surgical foreign body.

EXAM:
OR LOCAL ABDOMEN

[ap rld]
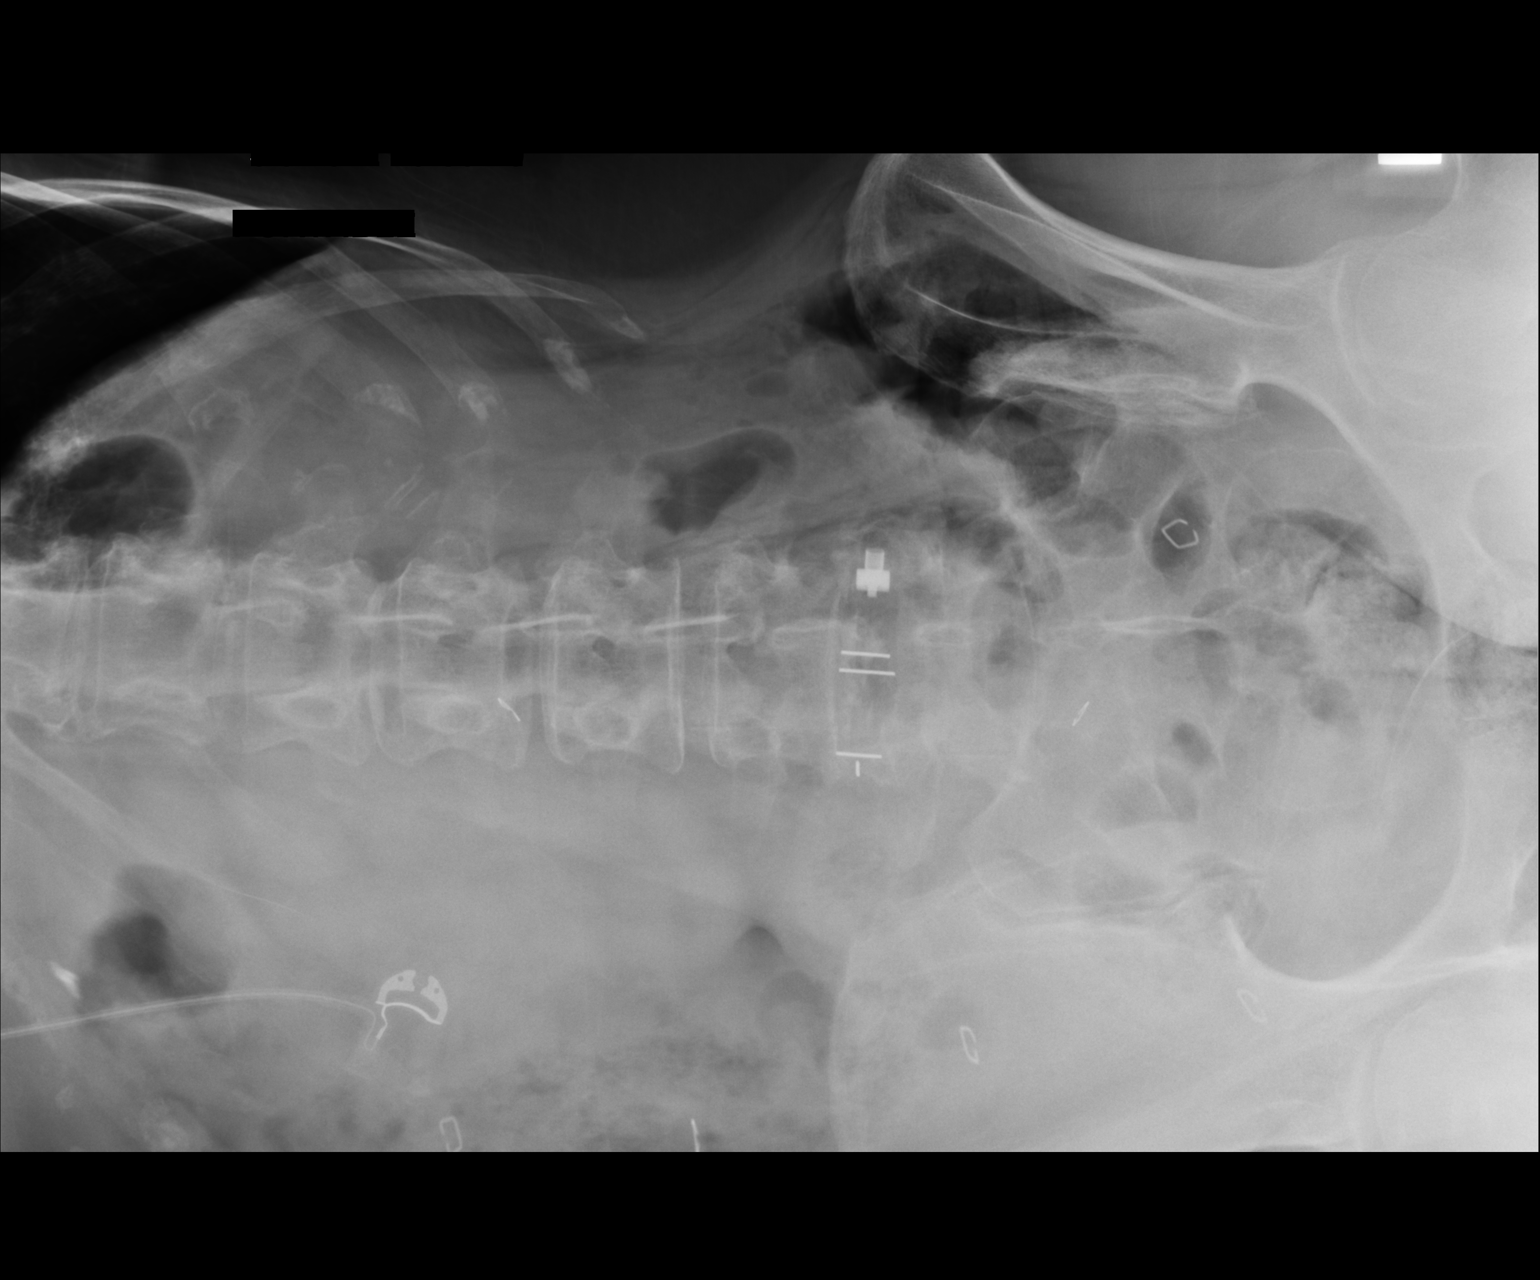

[1 of 1 positions shown; findings below may reference images not displayed]

FINDINGS: Left-side up cross-table lateral portable view of the abdomen at
8322 hours. Hypoplastic ribs at T12. New L4-L5 interbody implant
device. Eight metallic skin staples also project obliquely along the
right abdomen and pelvis. Stable cholecystectomy clips. No
radiopaque foreign body identified.

Bowel-gas pattern within normal limits.
IMPRESSION: No unexpected radiopaque foreign body identified. New L4-L5
interbody implant.

Study discussed by telephone with OR 4 personnel on 09/25/2020 at

## 2022-07-23 IMAGING — RF DG LUMBAR SPINE 2-3V
1 series · 2 of 2 positions shown · non-contrast
Comparison: MRI lumbar spine February 10, 2020.

CLINICAL DATA: L4-L5 [place].

EXAM:
DG C-ARM 1-60 MIN; LUMBAR SPINE - 2-3 VIEW
FLUOROSCOPY TIME:  Fluoroscopy Time:  4 minutes and 25 seconds.
Radiation Exposure Index (if provided by the fluoroscopic device):
157.03 mGy.
Number of Acquired Spot Images: 2

[Series 1: run · 2 of 2 slices shown]
[im 1/2]
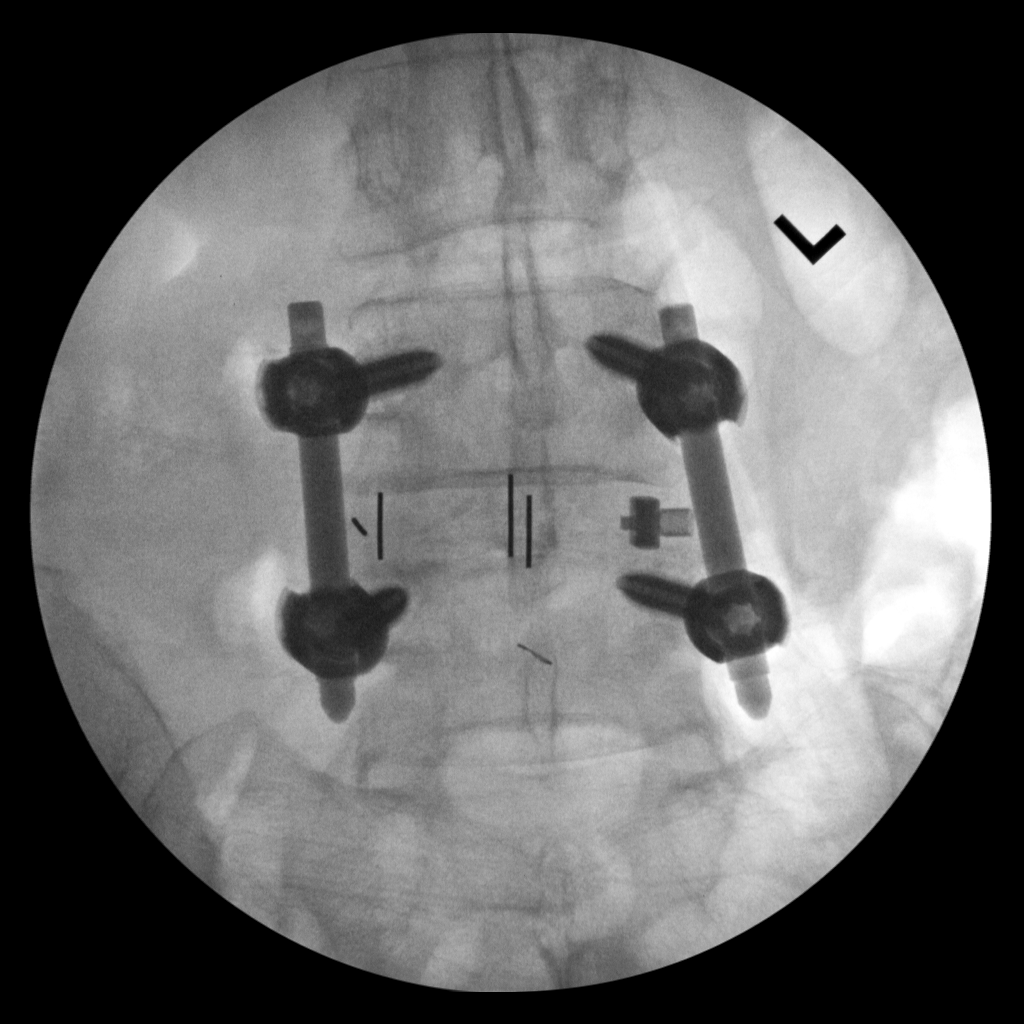
[im 2/2]
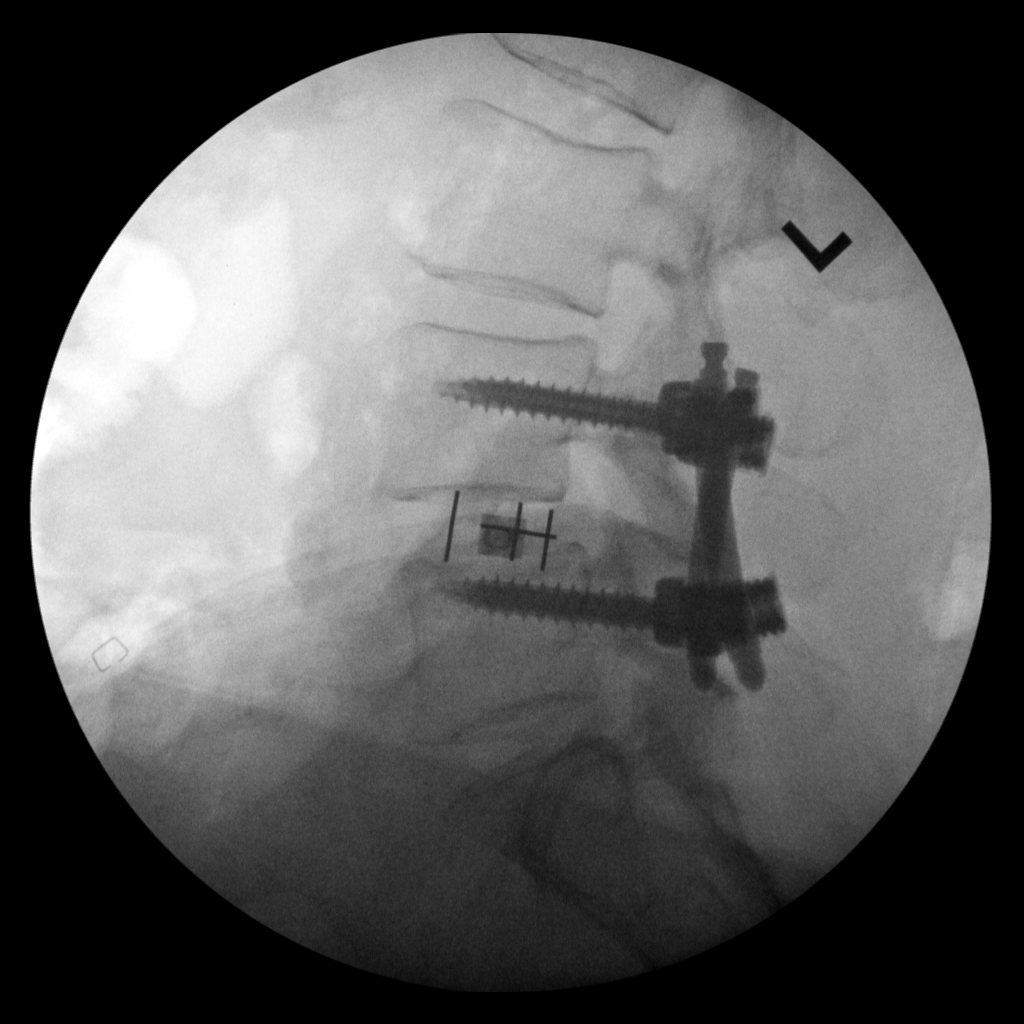

[2 of 2 positions shown; findings below may reference images not displayed]

FINDINGS: Two C-arm fluoroscopic images were obtained intraoperatively and
submitted for post operative interpretation. These demonstrate
bilateral pedicle screws at L4 and L5 with intervening rods and
intervening L4-L5 spacer. Mild (grade 1) anterolisthesis of L4 on
L5. Please see the performing provider's procedural report for
further detail.
IMPRESSION: Intraoperative fluoroscopy, as detailed above.

## 2022-07-23 IMAGING — CR DG OR LOCAL ABDOMEN
1 series · 1 of 1 positions shown · non-contrast
Comparison: Intra-abdominal radiograph.

CLINICAL DATA: Missing needle.  L4-5 fusion.

EXAM:
OR LOCAL ABDOMEN

[lateral]
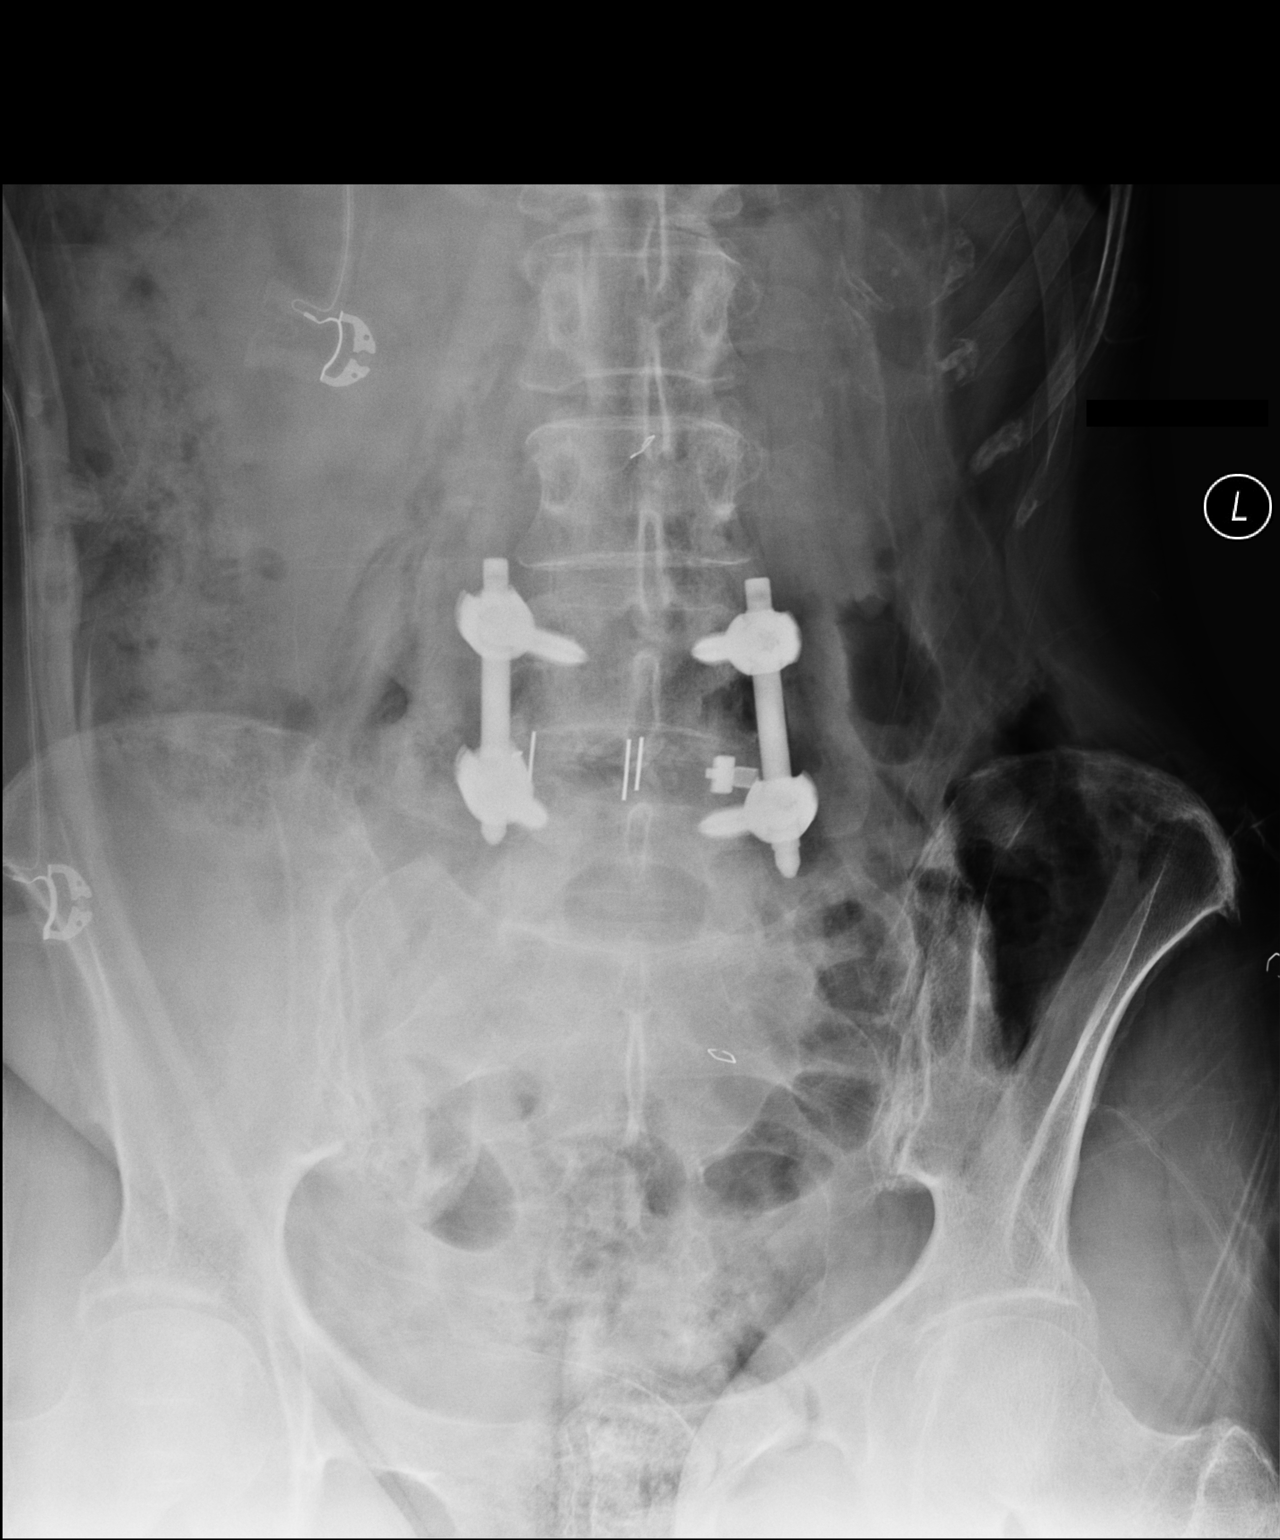

[1 of 1 positions shown; findings below may reference images not displayed]

FINDINGS: Skin staples are stable. Discectomy and pedicle fusion hardware
noted. No new radiopaque foreign body is present. Bowel gas pattern
is unremarkable.
IMPRESSION: 1. No new radiopaque foreign body.
2. Stable lumbar fusion.

These results were called by telephone at the time of interpretation
on 09/25/2020 at [DATE] to operating [HOSPITAL]. The results were
verbally acknowledged.

## 2023-01-14 ENCOUNTER — Other Ambulatory Visit: Payer: Self-pay | Admitting: Gastroenterology

## 2023-01-14 DIAGNOSIS — K859 Acute pancreatitis without necrosis or infection, unspecified: Secondary | ICD-10-CM

## 2023-01-25 ENCOUNTER — Ambulatory Visit
Admission: RE | Admit: 2023-01-25 | Discharge: 2023-01-25 | Disposition: A | Discharge status: Home / Self Care | Payer: 59 | Source: Ambulatory Visit | Attending: Gastroenterology | Admitting: Gastroenterology

## 2023-01-25 DIAGNOSIS — K859 Acute pancreatitis without necrosis or infection, unspecified: Secondary | ICD-10-CM

## 2023-01-25 MED ORDER — IOPAMIDOL (ISOVUE-300) INJECTION 61%
100.0000 mL | Freq: Once | INTRAVENOUS | Status: AC | PRN
  Administered 2023-01-25: 100 mL via INTRAVENOUS

## 2023-08-27 IMAGING — US US BIOPSY CORE LIVER
1 series · 12 of 12 positions shown · non-contrast
Comparison: none

INDICATION: 61-year-old female with elevated LFTs presents for biopsy

[Series 1: us biopsy (liver) · 12 of 12 slices shown]
[im 1/12]
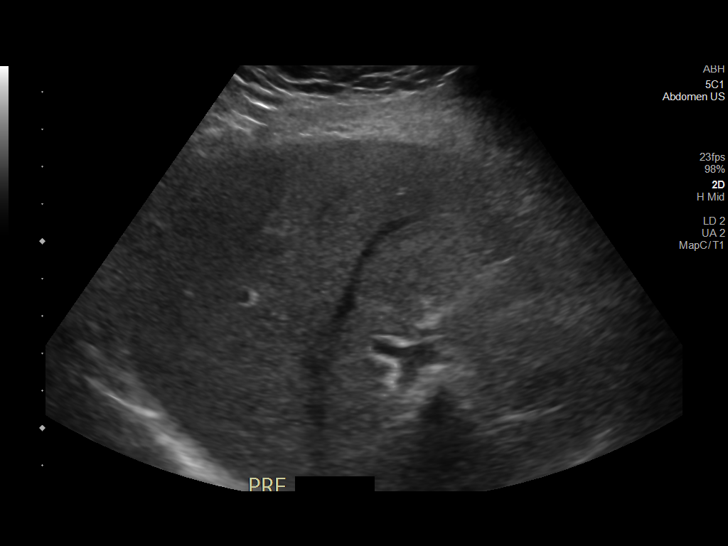
[im 2/12]
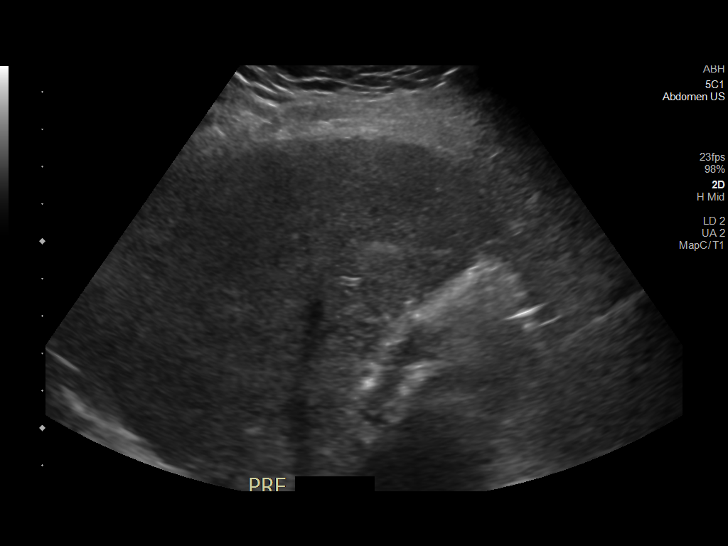
[im 3/12]
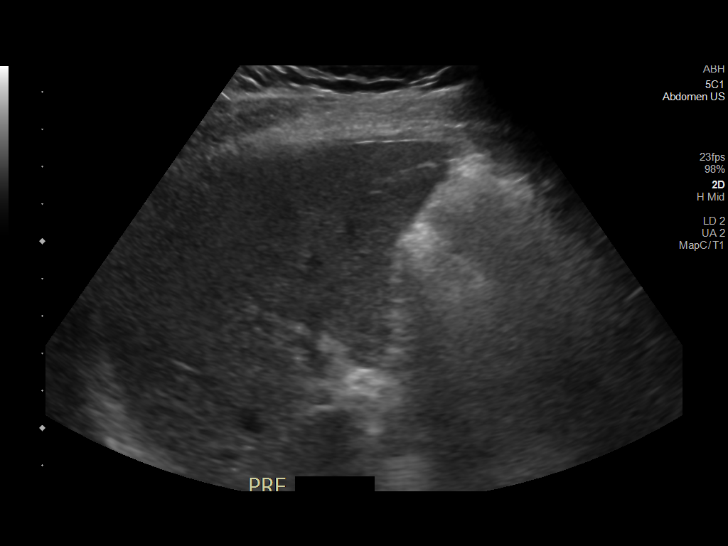
[im 4/12]
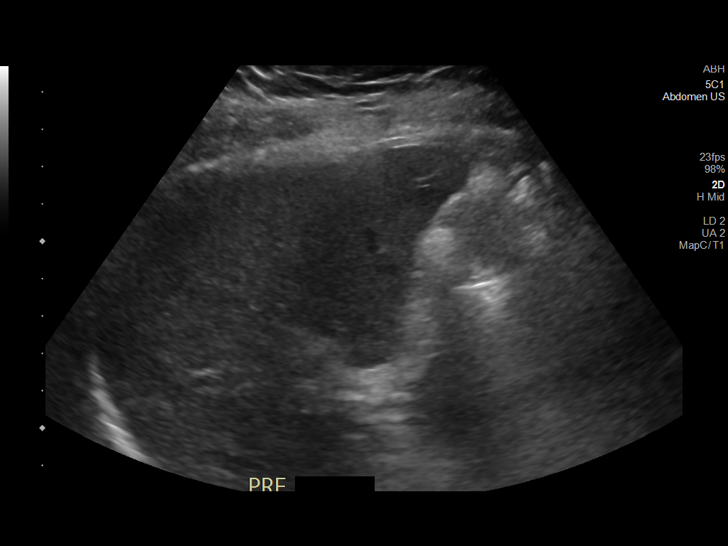
[im 5/12]
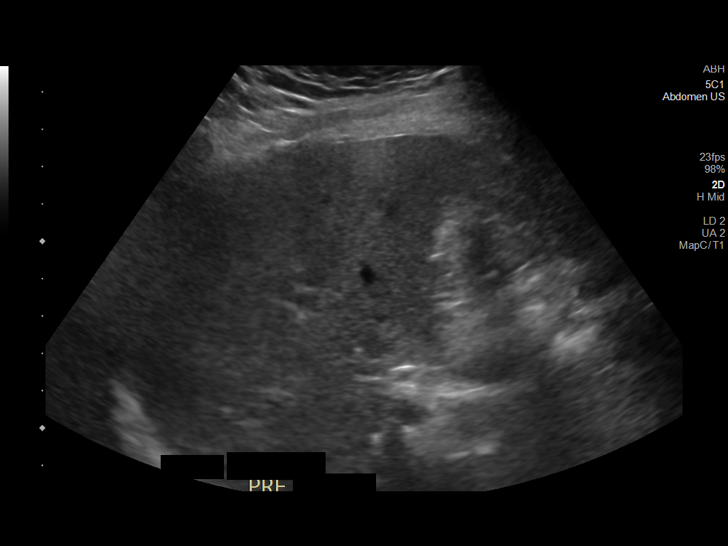
[im 6/12]
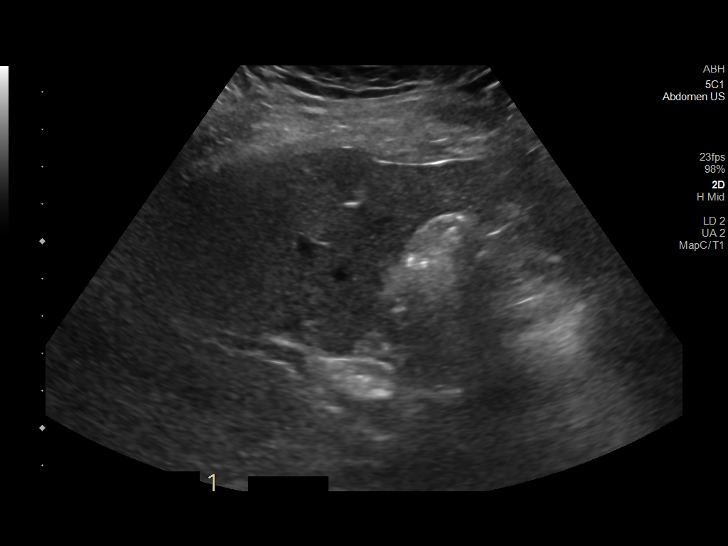
[im 7/12]
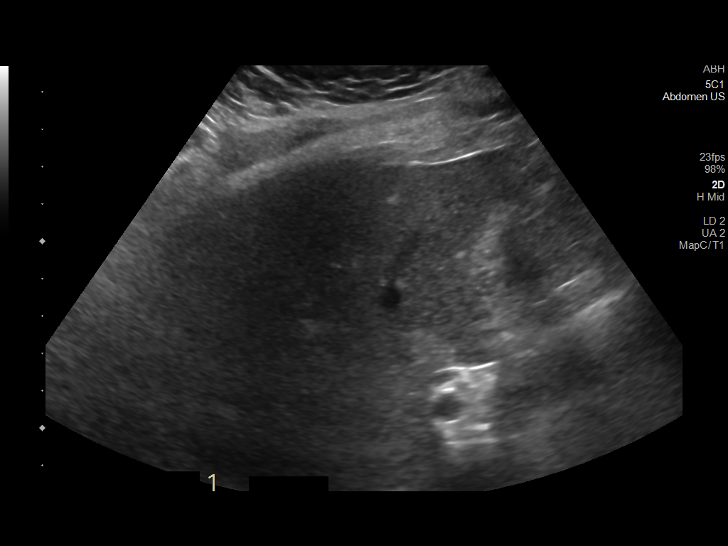
[im 8/12]
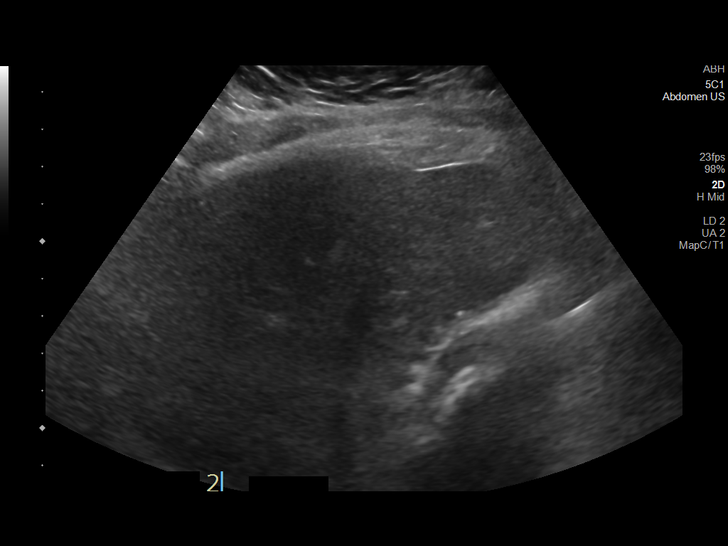
[im 9/12]
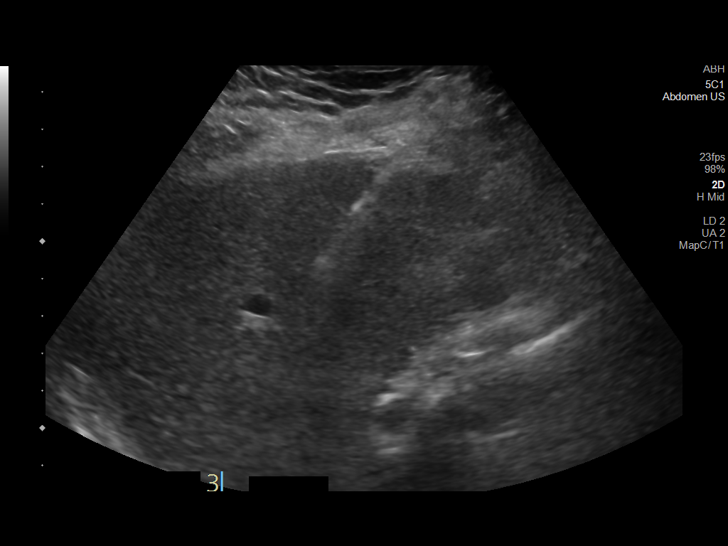
[im 10/12]
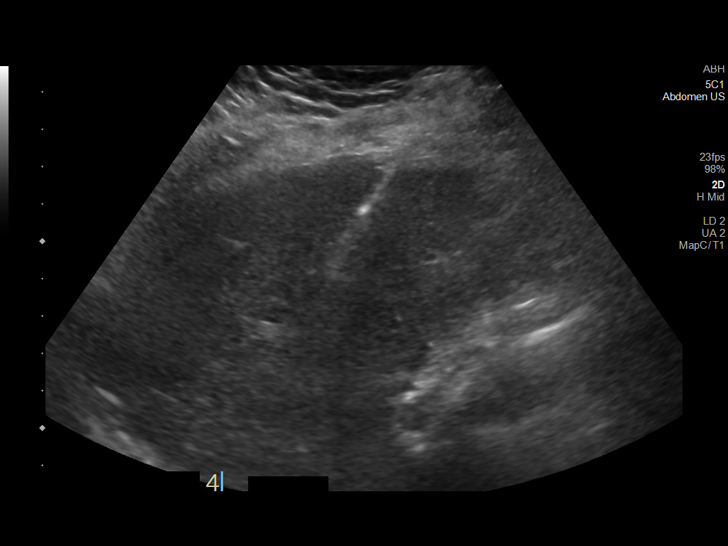
[im 11/12]
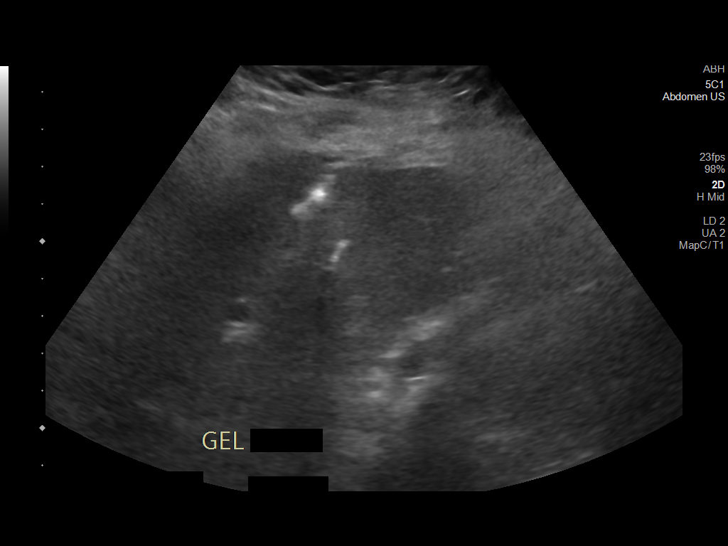
[im 12/12]
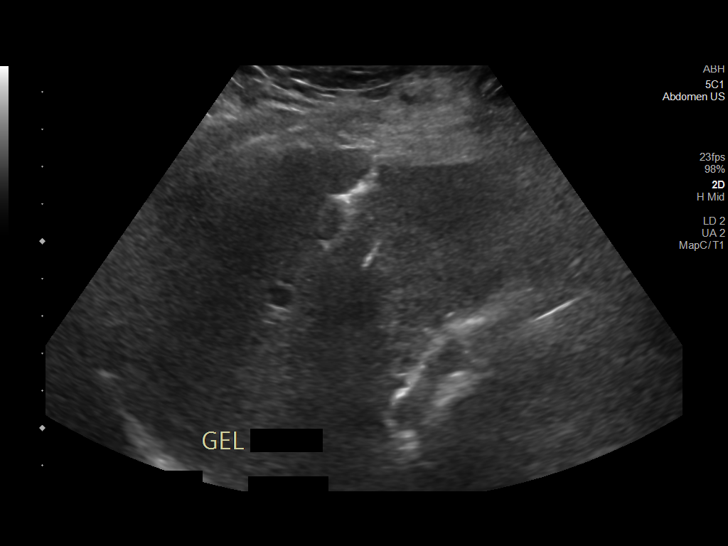

[12 of 12 positions shown; findings below may reference images not displayed]

EXAM:
IMAGE GUIDED MEDICAL LIVER BIOPSY

MEDICATIONS:
None.

ANESTHESIA/SEDATION:
Moderate (conscious) sedation was employed during this procedure. A
total of Versed 1.0 mg and Fentanyl 25 mcg was administered
intravenously by the radiology nurse.

Total intra-service moderate Sedation Time: 10 minutes. The
patient's level of consciousness and vital signs were monitored
continuously by radiology nursing throughout the procedure under my
direct supervision.

COMPLICATIONS:
None

PROCEDURE:
Informed written consent was obtained from the patient after a
thorough discussion of the procedural risks, benefits and
alternatives. All questions were addressed. Maximal Sterile Barrier
Technique was utilized including caps, mask, sterile gowns, sterile
gloves, sterile drape, hand hygiene and skin antiseptic. A timeout
was performed prior to the initiation of the procedure.

Ultrasound survey of the right liver lobe performed with images
stored and sent to PACs.

The right lower thorax/right upper abdomen was prepped with
chlorhexidine in a sterile fashion, and a sterile drape was applied
covering the operative field. A sterile gown and sterile gloves were
used for the procedure. Local anesthesia was provided with 1%
Lidocaine.

The patient was prepped and draped sterilely and the skin and
subcutaneous tissues were generously infiltrated with 1% lidocaine.

A 17 gauge introducer needle was then advanced under ultrasound
guidance in an intercostal location into the right liver lobe. The
stylet was removed, and multiple separate 18 gauge core biopsy were
retrieved. Samples were placed into formalin for transportation to
the lab.

Gel-Foam pledgets were then infused with a small amount of saline
for assistance with hemostasis.

The needle was removed, and a final ultrasound image was performed.

The patient tolerated the procedure well and remained
hemodynamically stable throughout.

No complications were encountered and no significant blood loss was
encounter.
IMPRESSION: Status post ultrasound-guided medical liver biopsy.

## 2024-02-07 ENCOUNTER — Other Ambulatory Visit: Payer: Self-pay

## 2024-02-07 ENCOUNTER — Emergency Department (HOSPITAL_COMMUNITY)
Admission: EM | Admit: 2024-02-07 | Discharge: 2024-02-07 | Disposition: A | Discharge status: Home / Self Care | Attending: Emergency Medicine | Admitting: Emergency Medicine

## 2024-02-07 ENCOUNTER — Encounter (HOSPITAL_COMMUNITY): Payer: Self-pay

## 2024-02-07 ENCOUNTER — Emergency Department (HOSPITAL_COMMUNITY)

## 2024-02-07 DIAGNOSIS — R1010 Upper abdominal pain, unspecified: Secondary | ICD-10-CM | POA: Diagnosis present

## 2024-02-07 DIAGNOSIS — E039 Hypothyroidism, unspecified: Secondary | ICD-10-CM | POA: Insufficient documentation

## 2024-02-07 DIAGNOSIS — R1013 Epigastric pain: Secondary | ICD-10-CM | POA: Diagnosis not present

## 2024-02-07 LAB — CBC
HCT: 46.8 % — ABNORMAL HIGH (ref 36.0–46.0)
Hemoglobin: 15.7 g/dL — ABNORMAL HIGH (ref 12.0–15.0)
MCH: 31.5 pg (ref 26.0–34.0)
MCHC: 33.5 g/dL (ref 30.0–36.0)
MCV: 94 fL (ref 80.0–100.0)
Platelets: 235 K/uL (ref 150–400)
RBC: 4.98 MIL/uL (ref 3.87–5.11)
RDW: 12.9 % (ref 11.5–15.5)
WBC: 7.5 K/uL (ref 4.0–10.5)
nRBC: 0 % (ref 0.0–0.2)

## 2024-02-07 LAB — COMPREHENSIVE METABOLIC PANEL WITH GFR
ALT: 68 U/L — ABNORMAL HIGH (ref 0–44)
AST: 56 U/L — ABNORMAL HIGH (ref 15–41)
Albumin: 4.4 g/dL (ref 3.5–5.0)
Alkaline Phosphatase: 70 U/L (ref 38–126)
Anion gap: 10 (ref 5–15)
BUN: 19 mg/dL (ref 8–23)
CO2: 23 mmol/L (ref 22–32)
Calcium: 9.9 mg/dL (ref 8.9–10.3)
Chloride: 106 mmol/L (ref 98–111)
Creatinine, Ser: 0.77 mg/dL (ref 0.44–1.00)
GFR, Estimated: >60 mL/min (ref >60)
Glucose, Bld: 108 mg/dL — ABNORMAL HIGH (ref 70–99)
Potassium: 4.3 mmol/L (ref 3.5–5.1)
Sodium: 139 mmol/L (ref 135–145)
Total Bilirubin: 0.7 mg/dL (ref 0.0–1.2)
Total Protein: 7.8 g/dL (ref 6.5–8.1)

## 2024-02-07 LAB — URINALYSIS, ROUTINE W REFLEX MICROSCOPIC
Bilirubin Urine: NEGATIVE
Glucose, UA: NEGATIVE mg/dL
Hgb urine dipstick: NEGATIVE
Ketones, ur: NEGATIVE mg/dL
Leukocytes,Ua: NEGATIVE
Nitrite: NEGATIVE
Protein, ur: NEGATIVE mg/dL
Specific Gravity, Urine: 1.014 (ref 1.005–1.030)
pH: 6 (ref 5.0–8.0)

## 2024-02-07 LAB — LIPASE, BLOOD: Lipase: 35 U/L (ref 11–51)

## 2024-02-07 MED ORDER — IOHEXOL 300 MG/ML  SOLN
100.0000 mL | Freq: Once | INTRAMUSCULAR | Status: AC | PRN
  Administered 2024-02-07: 100 mL via INTRAVENOUS

## 2024-02-07 MED ORDER — PANTOPRAZOLE SODIUM 40 MG IV SOLR
40.0000 mg | Freq: Once | INTRAVENOUS | Status: AC
  Administered 2024-02-07: 40 mg via INTRAVENOUS
  Filled 2024-02-07: qty 10

## 2024-02-07 MED ORDER — ONDANSETRON HCL 4 MG PO TABS: 4.0000 mg | ORAL_TABLET | Freq: Four times a day (QID) | ORAL | 0 refills | Status: AC

## 2024-02-07 MED ORDER — SUCRALFATE 1 G PO TABS: 1.0000 g | ORAL_TABLET | Freq: Three times a day (TID) | ORAL | 0 refills | Status: DC

## 2024-02-07 MED ORDER — ONDANSETRON HCL 4 MG/2ML IJ SOLN
4.0000 mg | Freq: Once | INTRAMUSCULAR | Status: AC
  Administered 2024-02-07: 4 mg via INTRAVENOUS
  Filled 2024-02-07: qty 2

## 2024-02-07 MED ORDER — SODIUM CHLORIDE 0.9 % IV BOLUS
500.0000 mL | Freq: Once | INTRAVENOUS | Status: AC
  Administered 2024-02-07: 500 mL via INTRAVENOUS

## 2024-02-07 NOTE — ED Provider Notes (Signed)
 Lori [name] Provider Note   CSN: [id number] Arrival date & time: 02/07/24  1536     Patient presents with: Abdominal Pain   Lori [name] is a 63 y.o. female.   Patient is a 63 year old female who presents with upper abdominal pain.  She has a history of GERD, restless leg syndrome, prediabetes, colitis, hyperlipidemia.  She said for the last 2 weeks she has had some pain across her upper abdomen.  Its gotten a little bit worse.  She describes it as a dull achy pain.  She has had some nausea but no vomiting.  She has had little bit of loose stools but that is not terribly uncommon with her colitis.  She does have a prior history of pancreatitis although was not sure what caused it.  She is status postcholecystectomy.  She denies any alcohol  use.  She denies any fevers.  No urinary symptoms.  The pain is not really related to eating.  She was seen by her PCP and sent here for further evaluation.       Prior to Admission medications   Medication Sig Start Date End Date Taking? Authorizing Provider  ondansetron  (ZOFRAN ) 4 MG tablet Take 1 tablet (4 mg total) by mouth every 6 (six) hours. 02/07/24  Yes Lenor Hollering, MD  sucralfate  (CARAFATE ) 1 g tablet Take 1 tablet (1 g total) by mouth 4 (four) times daily -  with meals and at bedtime. 02/07/24  Yes Lenor Hollering, MD  acetaminophen  (TYLENOL ) 650 MG CR tablet Take 650 mg by mouth every 8 (eight) hours as needed for pain.    [provider]  aspirin -acetaminophen -caffeine  (EXCEDRIN  MIGRAINE) 250-250-65 MG tablet Take 2 tablets by mouth daily as needed for headache.    [provider]  azelastine  (ASTELIN ) 0.1 % nasal spray Place 1 spray into both nostrils 2 (two) times daily as needed for rhinitis. Use in each nostril as directed    [provider]  bimatoprost (LUMIGAN) 0.01 % SOLN Place 1 drop into both eyes at bedtime.    [provider]  budesonide   (ENTOCORT EC ) 3 MG 24 hr capsule Take 3 mg by mouth daily. 10/25/19   [provider]  Calcium  Citrate-Vitamin D (CALCIUM  + D PO) Take 2 tablets by mouth 2 (two) times daily.    [provider]  clonazePAM  (KLONOPIN ) 1 MG tablet Take 1 mg by mouth at bedtime as needed (restless leg).    [provider]  Coenzyme Q10 (COQ-10) 100 MG CAPS Take 100 mg by mouth daily.    [provider]  dicyclomine  (BENTYL ) 10 MG capsule Take 10 mg by mouth 3 (three) times daily before meals. 07/26/20   [provider]  ezetimibe  (ZETIA ) 10 MG tablet Take 10 mg by mouth daily.    [provider]  Anselm Oil 500 MG CAPS Take 500 mg by mouth daily.    [provider]  levothyroxine  (SYNTHROID ) 50 MCG tablet Take 50 mcg by mouth daily before breakfast.    [provider]  montelukast  (SINGULAIR ) 10 MG tablet Take 10 mg by mouth at bedtime.    [provider]  Multiple Vitamin (MULTIVITAMIN WITH MINERALS) TABS tablet Take 1 tablet by mouth daily.    [provider]  Multiple Vitamins-Minerals (PRESERVISION AREDS 2) CAPS Take 1 capsule by mouth 2 (two) times daily.    [provider]  nystatin cream (MYCOSTATIN) Apply 1 application topically 2 (two) times  daily as needed for itching. Mixed with triamcinolone 01/16/21   [provider]  omeprazole (PRILOSEC) 40 MG capsule Take 40 mg by mouth daily. 05/20/17   [provider]  Pancrelipase , Lip-Prot-Amyl, (ZENPEP ) 40000-126000 units CPEP Take 2 capsules by mouth with breakfast, with lunch, and with evening meal.    [provider]  Polyethyl Glycol-Propyl Glycol (SYSTANE OP) Place 1 drop into both eyes daily.    [provider]  saccharomyces boulardii (FLORASTOR) 250 MG capsule Take 250 mg by mouth 2 (two) times daily.    [provider]  Selenium  200 MCG CAPS Take 200 mcg by mouth daily.    [provider]  triamcinolone cream  (KENALOG) 0.1 % Apply 1 application topically 2 (two) times daily as needed for itching. Mix with nystatin cream 01/16/21   [provider]    Allergies: Other, Codeine, Gabapentin , and Celebrex [celecoxib]    Review of Systems  Constitutional:  Negative for chills, diaphoresis, fatigue and fever.  HENT:  Negative for congestion, rhinorrhea and sneezing.   Eyes: Negative.   Respiratory:  Negative for cough, chest tightness and shortness of breath.   Cardiovascular:  Negative for chest pain and leg swelling.  Gastrointestinal:  Positive for abdominal pain, diarrhea (Looser stools) and nausea. Negative for blood in stool and vomiting.  Genitourinary:  Negative for difficulty urinating, flank pain, frequency and hematuria.  Musculoskeletal:  Negative for arthralgias and back pain.  Skin:  Negative for rash.  Neurological:  Positive for light-headedness (1 episode of dizziness and nausea when she stood up earlier today). Negative for weakness.    Updated Vital Signs BP 113/69   Pulse 66   Temp 98.7 F (37.1 C)   Resp 16   Ht 5' 6 (1.676 m)   Wt 80.3 kg   SpO2 97%   BMI 28.57 kg/m   Physical Exam Constitutional:      Appearance: She is well-developed.  HENT:     Head: Normocephalic and atraumatic.  Eyes:     Pupils: Pupils are equal, round, and reactive to light.  Cardiovascular:     Rate and Rhythm: Normal rate and regular rhythm.     Heart sounds: Normal heart sounds.  Pulmonary:     Effort: Pulmonary effort is normal. No respiratory distress.     Breath sounds: Normal breath sounds. No wheezing or rales.  Chest:     Chest wall: No tenderness.  Abdominal:     General: Bowel sounds are normal.     Palpations: Abdomen is soft.     Tenderness: There is abdominal tenderness in the epigastric area. There is no guarding or rebound.  Musculoskeletal:        General: Normal range of motion.     Cervical back: Normal range of motion and neck supple.  Lymphadenopathy:      Cervical: No cervical adenopathy.  Skin:    General: Skin is warm and dry.     Findings: No rash.  Neurological:     Mental Status: She is alert and oriented to person, place, and time.     (all labs ordered are listed, but only abnormal results are displayed) Labs Reviewed  COMPREHENSIVE METABOLIC PANEL WITH GFR - Abnormal; Notable for the following components:      Result Value   Glucose, Bld 108 (*)    AST 56 (*)    ALT 68 (*)    All other components within normal limits  CBC - Abnormal; Notable for  the following components:   Hemoglobin 15.7 (*)    HCT 46.8 (*)    All other components within normal limits  LIPASE, BLOOD  URINALYSIS, ROUTINE W REFLEX MICROSCOPIC    EKG: None  Radiology: CT ABDOMEN PELVIS W CONTRAST Result Date: 02/07/2024 CLINICAL DATA:  Acute upper abdominal pain for 2 weeks.  Nausea. EXAM: CT ABDOMEN AND PELVIS WITH CONTRAST TECHNIQUE: Multidetector CT imaging of the abdomen and pelvis was performed using the standard protocol following bolus administration of intravenous contrast. RADIATION DOSE REDUCTION: This exam was performed according to the departmental dose-optimization program which includes automated exposure control, adjustment of the mA and/or kV according to patient size and/or use of iterative reconstruction technique. CONTRAST:  OMNIPAQUE  IOHEXOL  300 MG/ML  SOLN COMPARISON:  01/25/2023 FINDINGS: Lower Chest: No acute findings. Chronic wall thickening of the distal thoracic esophagus is unchanged, consistent with chronic esophagitis. Hepatobiliary: Mild diffuse hepatic steatosis is seen. No suspicious hepatic masses identified. Prior cholecystectomy. No evidence of biliary obstruction. Pancreas:  No mass or inflammatory changes. Spleen: Within normal limits in size and appearance. Adrenals/Urinary Tract: No suspicious masses identified. No evidence of ureteral calculi or hydronephrosis. Unremarkable unopacified urinary bladder.  Stomach/Bowel: Tiny hiatal hernia noted. No evidence of obstruction, inflammatory process or abnormal fluid collections. Normal appendix visualized. Diverticulosis is seen mainly involving the descending and sigmoid colon, however there is no evidence of diverticulitis. Vascular/Lymphatic: No pathologically enlarged lymph nodes. No acute vascular findings. Reproductive:  No mass or other significant abnormality. Other:  None. Musculoskeletal: No suspicious bone lesions identified. Prior lumbar spine fusion at L4-5. IMPRESSION: No acute findings. Tiny hiatal hernia. Stable chronic wall thickening of distal thoracic esophagus, consistent with chronic esophagitis. Colonic diverticulosis, without radiographic evidence of diverticulitis. Mild hepatic steatosis. Electronically Signed   By: Norleen DELENA Kil M.D.   On: 02/07/2024 18:56     Procedures   Medications Ordered in the ED  sodium chloride  0.9 % bolus 500 mL (0 mLs Intravenous Stopped 02/07/24 1922)  ondansetron  (ZOFRAN ) injection 4 mg (4 mg Intravenous Given 02/07/24 1658)  pantoprazole  (PROTONIX ) injection 40 mg (40 mg Intravenous Given 02/07/24 1658)  iohexol  (OMNIPAQUE ) 300 MG/ML solution 100 mL (100 mLs Intravenous Contrast Given 02/07/24 1813)                                    Medical Decision Making Amount and/or Complexity of Data Reviewed Labs: ordered. Radiology: ordered.  Risk Prescription drug management.   This patient presents to the ED for concern of abdominal pain, this involves an extensive number of treatment options, and is a complaint that carries with it a high risk of complications and morbidity.  I considered the following differential and admission for this acute, potentially life threatening condition.  The differential diagnosis includes pancreatitis, gastritis, hepatitis, bowel obstruction, diverticulitis, colitis  MDM:    Patient is a 63 year old who presents with upper abdominal pain.  She has some nausea but no  vomiting.  No fevers.  No urinary symptoms.  Labs are nonconcerning.  No evidence of pancreatitis.  Her LFTs are mildly elevated but similar to prior values.  CT scan does not show any acute abnormality.  She does have some reflux symptoms and was given a dose of Protonix  in the ED.  I offered her a GI cocktail but she said she sensitive to different taste and textures.  She was discharged home in good condition.  Will start her on Carafate .  She is already taking omeprazole.  Will follow-up with Dr. Burnette.  She was discharged home in good condition.  Return precautions were given.  (Labs, imaging, consults)  Labs: I Ordered, and personally interpreted labs.  The pertinent results include: Normal lipase, mild elevation of LFTs  Imaging Studies ordered: I ordered imaging studies including CT abdomen pelvis I independently visualized and interpreted imaging. I agree with the radiologist interpretation  Additional history obtained from chart.  External records from outside source obtained and reviewed including prior notes  Cardiac Monitoring: The patient was maintained on a cardiac monitor.  If on the cardiac monitor, I personally viewed and interpreted the cardiac monitored which showed an underlying rhythm of: Sinus rhythm  Reevaluation: After the interventions noted above, I reevaluated the patient and found that they have :improved  Social Determinants of Health:    Disposition: Discharged to home  Co morbidities that complicate the patient evaluation  Past Medical History:  Diagnosis Date   Arthritis    Colitis    Costochondritis    Elevated liver enzymes    GERD (gastroesophageal reflux disease)    Hand pain    Headache    Hip pain    Hypothyroidism    Low back pain    Lumbar radiculopathy    Lumbar spondylosis    Ocular hypertension, bilateral    Pre-diabetes    controlled by diet and exercise   Restless leg syndrome    Seasonal allergies      Medicines Meds  ordered this encounter  Medications   sodium chloride  0.9 % bolus 500 mL   ondansetron  (ZOFRAN ) injection 4 mg   pantoprazole  (PROTONIX ) injection 40 mg   iohexol  (OMNIPAQUE ) 300 MG/ML solution 100 mL   sucralfate  (CARAFATE ) 1 g tablet    Sig: Take 1 tablet (1 g total) by mouth 4 (four) times daily -  with meals and at bedtime.    Dispense:  30 tablet    Refill:  0   ondansetron  (ZOFRAN ) 4 MG tablet    Sig: Take 1 tablet (4 mg total) by mouth every 6 (six) hours.    Dispense:  12 tablet    Refill:  0    I have reviewed the patients home medicines and have made adjustments as needed  Problem List / ED Course: Problem List Items Addressed This Visit   None Visit Diagnoses       Epigastric pain    -  Primary                Final diagnoses:  Epigastric pain    ED Discharge Orders          Ordered    sucralfate  (CARAFATE ) 1 g tablet  3 times daily with meals & bedtime        02/07/24 1959    ondansetron  (ZOFRAN ) 4 MG tablet  Every 6 hours        02/07/24 1959               Lenor Hollering, MD 02/07/24 2027

## 2024-02-07 NOTE — ED Triage Notes (Signed)
 Patient has had middle upper abdominal pain for 2 weeks that feels like it is traveling through her intestines. History of colitis and pancreatitis. Feels nauseous.

## 2024-02-07 NOTE — Discharge Instructions (Addendum)
 Continue taking your omeprazole.  Start taking the Carafate .  Follow-up with Dr. Burnette.  Return to the emergency room if you have any worsening symptoms.

## 2024-02-07 NOTE — ED Notes (Signed)
 Discharge instructions reviewed with patient. Patient questions answered and opportunity for education reviewed. Patient voices understanding of discharge instructions with no further questions. Patient ambulatory with steady gait to lobby.

## 2024-03-27 ENCOUNTER — Other Ambulatory Visit (HOSPITAL_COMMUNITY): Payer: Self-pay | Admitting: Gastroenterology

## 2024-03-27 DIAGNOSIS — R7989 Other specified abnormal findings of blood chemistry: Secondary | ICD-10-CM

## 2024-04-05 ENCOUNTER — Other Ambulatory Visit (HOSPITAL_COMMUNITY)

## 2024-04-11 ENCOUNTER — Ambulatory Visit (HOSPITAL_COMMUNITY)
Admission: RE | Admit: 2024-04-11 | Discharge: 2024-04-11 | Disposition: A | Discharge status: Home / Self Care | Source: Ambulatory Visit | Attending: Gastroenterology | Admitting: Gastroenterology

## 2024-04-11 DIAGNOSIS — R7989 Other specified abnormal findings of blood chemistry: Secondary | ICD-10-CM | POA: Insufficient documentation

## 2024-07-07 ENCOUNTER — Encounter (HOSPITAL_BASED_OUTPATIENT_CLINIC_OR_DEPARTMENT_OTHER): Payer: Self-pay | Admitting: Orthopaedic Surgery

## 2024-07-07 ENCOUNTER — Other Ambulatory Visit: Payer: Self-pay

## 2024-07-18 NOTE — Discharge Instructions (Signed)
 Lillia Mountain, MD [facility]  Please read the following information regarding your care after surgery.  Medications   - Oxycodone  5 mg every 6 hours as needed for pain - Aspirin  81 mg twice daily as scheduled to prevent blood clots - Colace 100 mg twice daily as needed for constipation - Zofran  4 mg every 8 hours as needed for nausea/vomiting  We send above prescriptions to your pharmacy on file.  In addition you may also use: ? acetaminophen  (Tylenol ) 500 mg every 4-6 hours as you need for minor to moderate pain  Resume all other routine medications per usual or as directed by your PCP/other specialists.  Weight Bearing ? Do NOT bear any weight on the operated leg or foot. This means do NOT touch your surgical leg to the ground!  Cast / Splint / Dressing ? If you have a splint, do NOT remove this. Keep your splint, cast or dressing clean and dry.  Dont put anything (coat hanger, pencil, etc) down inside of it.  If it gets wet, call the office immediately to schedule an appointment for a cast change.  Swelling IMPORTANT: It is normal for you to have swelling where you had surgery. To reduce swelling and pain, keep at least 3 pillows under your leg so that your toes are above your nose and your heel is above the level of your hip.  It may be necessary to keep your foot or leg elevated for several weeks.  This is critical to helping your incisions heal and your pain to feel better.  Follow Up Call my office at [phone] when you are discharged from the hospital or surgery center to schedule an appointment to be seen 7-10 days after surgery.  Call my office at [phone] if you develop a fever >101.5 F, nausea, vomiting, bleeding from the surgical site or severe pain.     Post Anesthesia Home Care Instructions  Activity: Get plenty of rest for the remainder of the day. A responsible individual must stay with you for 24 hours following the procedure.  For the next 24  hours, DO NOT: -Drive a car -Advertising copywriter -Drink alcoholic beverages -Take any medication unless instructed by your physician -Make any legal decisions or sign important papers.  Meals: Start with liquid foods such as gelatin or soup. Progress to regular foods as tolerated. Avoid greasy, spicy, heavy foods. If nausea and/or vomiting occur, drink only clear liquids until the nausea and/or vomiting subsides. Call your physician if vomiting continues.  Special Instructions/Symptoms: Your throat may feel dry or sore from the anesthesia or the breathing tube placed in your throat during surgery. If this causes discomfort, gargle with warm salt water . The discomfort should disappear within 24 hours.  If you had a scopolamine  patch placed behind your ear for the management of post- operative nausea and/or vomiting:  1. The medication in the patch is effective for 72 hours, after which it should be removed.  Wrap patch in a tissue and discard in the trash. Wash hands thoroughly with soap and water . 2. You may remove the patch earlier than 72 hours if you experience unpleasant side effects which may include dry mouth, dizziness or visual disturbances. 3. Avoid touching the patch. Wash your hands with soap and water  after contact with the patch.     Regional Anesthesia Blocks  1. You may not be able to move or feel the blocked extremity after a regional anesthetic block. This may last may last from 3-48 hours  after placement, but it will go away. The length of time depends on the medication injected and your individual response to the medication. As the nerves start to wake up, you may experience tingling as the movement and feeling returns to your extremity. If the numbness and inability to move your extremity has not gone away after 48 hours, please call your surgeon.   2. The extremity that is blocked will need to be protected until the numbness is gone and the strength has returned. Because  you cannot feel it, you will need to take extra care to avoid injury. Because it may be weak, you may have difficulty moving it or using it. You may not know what position it is in without looking at it while the block is in effect.  3. For blocks in the legs and feet, returning to weight bearing and walking needs to be done carefully. You will need to wait until the numbness is entirely gone and the strength has returned. You should be able to move your leg and foot normally before you try and bear weight or walk. You will need someone to be with you when you first try to ensure you do not fall and possibly risk injury.  4. Bruising and tenderness at the needle site are common side effects and will resolve in a few days.  5. Persistent numbness or new problems with movement should be communicated to the surgeon or the [facility] [phone] [facility] [phone]).  Last received tylenol  at 2pm

## 2024-07-18 NOTE — H&P (Signed)
 ORTHOPAEDIC SURGERY H&P  Subjective:  The patient presents for left Achilles reconstruction, calcaneus exostectomy, possible tendon transfer, plantar fascia release.   Past Medical History:  Diagnosis Date   Arthritis    Colitis    Costochondritis    Elevated liver enzymes    GERD (gastroesophageal reflux disease)    Hand pain    Headache    Hip pain    Hypothyroidism    Low back pain    Lumbar radiculopathy    Lumbar spondylosis    Ocular hypertension, bilateral    Pre-diabetes    controlled by diet and exercise   Restless leg syndrome    Seasonal allergies     Past Surgical History:  Procedure Laterality Date   ABDOMINAL EXPOSURE N/A 09/25/2020   Procedure: ABDOMINAL EXPOSURE;  Surgeon: Oris Krystal FALCON, MD;  Location: [facility] OR;  Service: Vascular;  Laterality: N/A;   ANTERIOR AND POSTERIOR SPINAL FUSION N/A 09/25/2020   Procedure: OBLIQUE LUMBAR INTERBODY FUSION (OLIF)  LUMBAR FOUR THROUGH FIVE, POSTERIOR SPINAL FUSION INTERBODY;  Surgeon: Burnetta Aures, MD;  Location: [facility] OR;  Service: Orthopedics;  Laterality: N/A;  4 hrs Dr. Oris to do approach Tap block with exparel    broken arm  1967   left   CATARACT EXTRACTION     CHOLECYSTECTOMY     DILATION AND CURETTAGE OF UTERUS     KNEE ARTHROSCOPY Left    ROBOTIC ASSITED PARTIAL NEPHRECTOMY Right 08/01/2021   Procedure: XI ROBOTIC ASSITED RIGHT PARTIAL NEPHRECTOMY;  Surgeon: Cam Morene ORN, MD;  Location: [facility] ORS;  Service: Urology;  Laterality: Right;   TOTAL KNEE ARTHROPLASTY Left 07/26/2015   TOTAL KNEE ARTHROPLASTY Left 07/26/2015   Procedure: LEFT TOTAL KNEE ARTHROPLASTY;  Surgeon: Marcey Her, MD;  Location: [facility] OR;  Service: Orthopedics;  Laterality: Left;   TOTAL KNEE ARTHROPLASTY Right 07/31/2016   Procedure: TOTAL KNEE ARTHROPLASTY;  Surgeon: Marcey Her, MD;  Location: [facility] OR;  Service: Orthopedics;  Laterality: Right;   WISDOM TOOTH EXTRACTION       Show/hide medication list[1]   Allergies[2]  Social History    Socioeconomic History   Marital status: Married    Spouse name: Not on file   Number of children: Not on file   Years of education: Not on file   Highest education level: Not on file  Occupational History   Not on file  Tobacco Use   Smoking status: Never   Smokeless tobacco: Never  Vaping Use   Vaping status: Never Used  Substance and Sexual Activity   Alcohol  use: No    Comment: stopped   Drug use: No   Sexual activity: Not Currently    Birth control/protection: None  Other Topics Concern   Not on file  Social History Narrative   Not on file   Social Drivers of Health   Tobacco Use: Low Risk (07/07/2024)   Patient History    Smoking Tobacco Use: Never    Smokeless Tobacco Use: Never    Passive Exposure: Not on file  Financial Resource Strain: Not on file  Food Insecurity: Not on file  Transportation Needs: Not on file  Physical Activity: Not on file  Stress: Not on file  Social Connections: Not on file  Intimate Partner Violence: Not on file  Depression (EYV7-0): Not on file  Alcohol  Screen: Not on file  Housing: Unknown (05/17/2024)   Received from [facility]   Epic    Unable to Pay for Housing in the Last  Year: Not on file    Number of Times Moved in the Last Year: Not on file    At any time in the past 12 months, were you homeless or living in a shelter (including now)?: No  Utilities: Not on file  Health Literacy: Not on file     History reviewed. No pertinent family history.   Review of Systems Pertinent items are noted in HPI.  Objective: Vital signs in last 24 hours:    07/07/2024    2:50 PM 02/07/2024    8:15 PM 02/07/2024    8:00 PM  Vitals with BMI  Height 5' 6    Weight 176 lbs 6 oz    BMI 28.48    Systolic  109 109  Diastolic  70 66  Pulse  64 66      EXAM: General: Well nourished, well developed. Awake, alert and oriented to time, place, person. Normal mood and affect. No apparent distress. Breathing room  air.  Operative Lower Extremity: Alignment - Neutral Deformity - None Skin intact Tenderness to palpation - Achilles insertion 5/5 TA, PT, GS, Per, EHL, FHL Sensation intact to light touch throughout Palpable DP and PT pulses Special testing: None  The contralateral foot/ankle was examined for comparison and noted to be neurovascularly intact with no localized deformity, swelling, or tenderness.  Imaging Review All images taken were independently reviewed by me.  Assessment/Plan: The clinical and radiographic findings were reviewed and discussed at length with the patient.  The patient presents for left Achilles reconstruction, calcaneus exostectomy, possible tendon transfer, plantar fascia release.  We spoke at length about the natural course of these findings. We discussed nonoperative and operative treatment options in detail.  The risks and benefits were presented and reviewed. The risks due to hardware/suture failure and/or irritation (if removing hardware: inability to remove part/all of hardware, recurrent instability), new/persistent infection, stiffness, nerve/vessel/tendon injury or rerupture of repaired tendon, nonunion/malunion, allograft usage, wound healing issues, development of arthritis, failure of this surgery, possibility of external fixation with delayed definitive surgery, need for further surgery, thromboembolic events, anesthesia/medical complications, amputation, death among others were discussed.  Lori [name]  Orthopaedic Surgery [facility]     [1] (Not in an outpatient encounter) [2]  Allergies Allergen Reactions   Other Nausea And Vomiting and Other (See Comments)    Strong pain medications must be taken with nausea medicine    PRE-DIABETIC CONTROLLED THROUGH DIET   Codeine Nausea And Vomiting   Gabapentin  Other (See Comments)    Red/dry eyes   Celebrex [Celecoxib] Other (See Comments)    Gives her a 'really bad headache'

## 2024-07-19 ENCOUNTER — Ambulatory Visit (HOSPITAL_BASED_OUTPATIENT_CLINIC_OR_DEPARTMENT_OTHER): Admitting: Anesthesiology

## 2024-07-19 ENCOUNTER — Ambulatory Visit (HOSPITAL_BASED_OUTPATIENT_CLINIC_OR_DEPARTMENT_OTHER)
Admission: RE | Admit: 2024-07-19 | Discharge: 2024-07-19 | Disposition: A | Discharge status: Home / Self Care | Source: Home / Self Care | Attending: Orthopaedic Surgery | Admitting: Orthopaedic Surgery

## 2024-07-19 ENCOUNTER — Encounter (HOSPITAL_BASED_OUTPATIENT_CLINIC_OR_DEPARTMENT_OTHER): Payer: Self-pay | Admitting: Orthopaedic Surgery

## 2024-07-19 ENCOUNTER — Other Ambulatory Visit: Payer: Self-pay

## 2024-07-19 ENCOUNTER — Ambulatory Visit (HOSPITAL_COMMUNITY)

## 2024-07-19 ENCOUNTER — Encounter (HOSPITAL_BASED_OUTPATIENT_CLINIC_OR_DEPARTMENT_OTHER)
Admission: RE | Disposition: A | Discharge status: Home / Self Care | Payer: Self-pay | Source: Home / Self Care | Attending: Orthopaedic Surgery

## 2024-07-19 DIAGNOSIS — M722 Plantar fascial fibromatosis: Secondary | ICD-10-CM | POA: Diagnosis present

## 2024-07-19 DIAGNOSIS — M7662 Achilles tendinitis, left leg: Secondary | ICD-10-CM

## 2024-07-19 HISTORY — PX: ACHILLES TENDON SURGERY: SHX542

## 2024-07-19 HISTORY — PX: HEEL SPUR RESECTION: SHX6410

## 2024-07-19 SURGERY — RECONSTRUCTION, TENDON, ACHILLES
Anesthesia: General | Site: Heel | Laterality: Left

## 2024-07-19 MED ORDER — ONDANSETRON HCL 4 MG/2ML IJ SOLN
INTRAMUSCULAR | Status: DC | PRN
  Administered 2024-07-19: 4 mg via INTRAVENOUS

## 2024-07-19 MED ORDER — DEXMEDETOMIDINE HCL IN NACL 80 MCG/20ML IV SOLN
INTRAVENOUS | Status: DC | PRN
  Administered 2024-07-19: 4 ug via INTRAVENOUS

## 2024-07-19 MED ORDER — ONDANSETRON HCL 4 MG/2ML IJ SOLN: 4.0000 mg | Freq: Four times a day (QID) | INTRAMUSCULAR | Status: DC | PRN

## 2024-07-19 MED ORDER — FENTANYL CITRATE (PF) 100 MCG/2ML IJ SOLN: 25.0000 ug | INTRAMUSCULAR | Status: DC | PRN

## 2024-07-19 MED ORDER — ACETAMINOPHEN 10 MG/ML IV SOLN
INTRAVENOUS | Status: AC
  Filled 2024-07-19: qty 100

## 2024-07-19 MED ORDER — CHLORHEXIDINE GLUCONATE 4 % EX SOLN: 60.0000 mL | Freq: Once | CUTANEOUS | Status: DC

## 2024-07-19 MED ORDER — LACTATED RINGERS IV SOLN: INTRAVENOUS | Status: DC

## 2024-07-19 MED ORDER — 0.9 % SODIUM CHLORIDE (POUR BTL) OPTIME
TOPICAL | Status: DC | PRN
  Administered 2024-07-19: 1000 mL

## 2024-07-19 MED ORDER — FENTANYL CITRATE (PF) 100 MCG/2ML IJ SOLN
INTRAMUSCULAR | Status: AC
  Filled 2024-07-19: qty 2

## 2024-07-19 MED ORDER — OXYCODONE HCL 5 MG PO TABS: 5.0000 mg | ORAL_TABLET | Freq: Once | ORAL | Status: DC | PRN

## 2024-07-19 MED ORDER — FENTANYL CITRATE (PF) 100 MCG/2ML IJ SOLN
INTRAMUSCULAR | Status: DC | PRN
  Administered 2024-07-19: 25 ug via INTRAVENOUS
  Administered 2024-07-19: 50 ug via INTRAVENOUS

## 2024-07-19 MED ORDER — MIDAZOLAM HCL (PF) 2 MG/2ML IJ SOLN
2.0000 mg | Freq: Once | INTRAMUSCULAR | Status: AC
  Administered 2024-07-19: 1 mg via INTRAVENOUS

## 2024-07-19 MED ORDER — CEFAZOLIN SODIUM-DEXTROSE 2-4 GM/100ML-% IV SOLN
2.0000 g | INTRAVENOUS | Status: AC
  Administered 2024-07-19: 2 g via INTRAVENOUS

## 2024-07-19 MED ORDER — LIDOCAINE HCL (CARDIAC) PF 100 MG/5ML IV SOSY
PREFILLED_SYRINGE | INTRAVENOUS | Status: DC | PRN
  Administered 2024-07-19: 40 mg via INTRAVENOUS

## 2024-07-19 MED ORDER — DEXAMETHASONE SODIUM PHOSPHATE 4 MG/ML IJ SOLN
INTRAMUSCULAR | Status: DC | PRN
  Administered 2024-07-19: 8 mg via INTRAVENOUS

## 2024-07-19 MED ORDER — ROCURONIUM BROMIDE 100 MG/10ML IV SOLN
INTRAVENOUS | Status: DC | PRN
  Administered 2024-07-19 (×2): 10 mg via INTRAVENOUS
  Administered 2024-07-19: 50 mg via INTRAVENOUS

## 2024-07-19 MED ORDER — OXYCODONE HCL 5 MG/5ML PO SOLN: 5.0000 mg | Freq: Once | ORAL | Status: DC | PRN

## 2024-07-19 MED ORDER — PROPOFOL 10 MG/ML IV BOLUS
INTRAVENOUS | Status: DC | PRN
  Administered 2024-07-19: 200 mg via INTRAVENOUS

## 2024-07-19 MED ORDER — AMISULPRIDE (ANTIEMETIC) 5 MG/2ML IV SOLN
INTRAVENOUS | Status: AC
  Filled 2024-07-19: qty 4

## 2024-07-19 MED ORDER — SUGAMMADEX SODIUM 200 MG/2ML IV SOLN
INTRAVENOUS | Status: DC | PRN
  Administered 2024-07-19: 200 mg via INTRAVENOUS

## 2024-07-19 MED ORDER — ACETAMINOPHEN 10 MG/ML IV SOLN
INTRAVENOUS | Status: DC | PRN
  Administered 2024-07-19: 1000 mg via INTRAVENOUS

## 2024-07-19 MED ORDER — CEFAZOLIN SODIUM-DEXTROSE 2-4 GM/100ML-% IV SOLN
INTRAVENOUS | Status: AC
  Filled 2024-07-19: qty 100

## 2024-07-19 MED ORDER — BUPIVACAINE-EPINEPHRINE (PF) 0.5% -1:200000 IJ SOLN
INTRAMUSCULAR | Status: DC | PRN
  Administered 2024-07-19: 25 mL via PERINEURAL
  Administered 2024-07-19: 15 mL via PERINEURAL

## 2024-07-19 MED ORDER — MIDAZOLAM HCL 2 MG/2ML IJ SOLN
INTRAMUSCULAR | Status: AC
  Filled 2024-07-19: qty 2

## 2024-07-19 MED ORDER — AMISULPRIDE (ANTIEMETIC) 5 MG/2ML IV SOLN
10.0000 mg | Freq: Once | INTRAVENOUS | Status: AC
  Administered 2024-07-19: 10 mg via INTRAVENOUS

## 2024-07-19 SURGICAL SUPPLY — 55 items
ANCHOR FIBRTK DL SP W/NDL 2.6 (Anchor) IMPLANT
ANCHOR INTERNALBRACE SLOCK4.75 (Anchor) IMPLANT
BLADE AVERAGE 25X9 (BLADE) IMPLANT
BLADE MICRO SAGITTAL (BLADE) IMPLANT
BLADE SAW SGTL 13.0X1.19X90.0M (BLADE) IMPLANT
BLADE SURG 15 STRL LF DISP TIS (BLADE) ×4 IMPLANT
BNDG COMPR ESMARK 6X3 LF (GAUZE/BANDAGES/DRESSINGS) IMPLANT
BNDG ELASTIC 6X10 VLCR STRL LF (GAUZE/BANDAGES/DRESSINGS) ×2 IMPLANT
BNDG STRETCH GAUZE 3IN X12FT (GAUZE/BANDAGES/DRESSINGS) ×2 IMPLANT
CANISTER SUCT 1200ML W/VALVE (MISCELLANEOUS) ×2 IMPLANT
CHLORAPREP W/TINT 26 (MISCELLANEOUS) ×2 IMPLANT
COVER BACK TABLE 60X90IN (DRAPES) ×2 IMPLANT
CUFF TRNQT CYL 34X4.125X (TOURNIQUET CUFF) ×2 IMPLANT
DRAPE C-ARM 42X72 X-RAY (DRAPES) IMPLANT
DRAPE C-ARMOR (DRAPES) IMPLANT
DRAPE EXTREMITY T 121X128X90 (DISPOSABLE) ×2 IMPLANT
DRAPE IMP U-DRAPE 54X76 (DRAPES) ×2 IMPLANT
DRAPE OEC MINIVIEW 54X84 (DRAPES) IMPLANT
DRAPE U-SHAPE 47X51 STRL (DRAPES) ×2 IMPLANT
DRSG MEPITEL 4X7.2 (GAUZE/BANDAGES/DRESSINGS) ×2 IMPLANT
ELECTRODE REM PT RTRN 9FT ADLT (ELECTROSURGICAL) ×2 IMPLANT
GAUZE PAD ABD 8X10 STRL (GAUZE/BANDAGES/DRESSINGS) ×10 IMPLANT
GAUZE SPONGE 4X4 12PLY STRL (GAUZE/BANDAGES/DRESSINGS) ×2 IMPLANT
GLOVE BIOGEL PI IND STRL 8 (GLOVE) ×2 IMPLANT
GLOVE SURG SS PI 7.5 STRL IVOR (GLOVE) ×4 IMPLANT
GOWN STRL REUS W/ TWL LRG LVL3 (GOWN DISPOSABLE) ×4 IMPLANT
Internal brace WD #230823 IMPLANT
KIT KNEE FIBERTAK DISP (KITS) IMPLANT
NDL SUT 6 .5 CRC .975X.05 MAYO (NEEDLE) IMPLANT
NEEDLE HYPO 25X1 1.5 SAFETY (NEEDLE) IMPLANT
PACK BASIN DAY SURGERY FS (CUSTOM PROCEDURE TRAY) ×2 IMPLANT
PADDING CAST ABS COTTON 4X4 ST (CAST SUPPLIES) IMPLANT
PADDING CAST SYNTHETIC 4X4 STR (CAST SUPPLIES) ×6 IMPLANT
PADDING CAST SYNTHETIC 6X4 NS (CAST SUPPLIES) ×6 IMPLANT
PENCIL SMOKE EVACUATOR (MISCELLANEOUS) ×2 IMPLANT
SANITIZER HAND ALTRA PUMP 550 (MISCELLANEOUS) ×2 IMPLANT
SHEET MEDIUM DRAPE 40X70 STRL (DRAPES) ×2 IMPLANT
SLEEVE SCD COMPRESS KNEE MED (STOCKING) ×2 IMPLANT
SPLINT FIBERGLASS 4X30 (CAST SUPPLIES) ×2 IMPLANT
SPONGE T-LAP 18X18 ~~LOC~~+RFID (SPONGE) ×2 IMPLANT
SUCTION TUBE FRAZIER 10FR DISP (SUCTIONS) IMPLANT
SUT ETHILON 2 0 FS 18 (SUTURE) IMPLANT
SUT MNCRL AB 3-0 PS2 18 (SUTURE) IMPLANT
SUT VIC AB 0 CT1 27XBRD ANBCTR (SUTURE) ×2 IMPLANT
SUT VIC AB 1 CT1 27XBRD ANBCTR (SUTURE) IMPLANT
SUT VIC AB 2-0 CT1 TAPERPNT 27 (SUTURE) IMPLANT
SUT VIC AB 3-0 SH 27X BRD (SUTURE) ×2 IMPLANT
SUTURE FIBERWR #2 38 T-5 BLUE (SUTURE) IMPLANT
SUTURE TAPE 1.3 FIBERLOP 20 ST (SUTURE) IMPLANT
SYR BULB EAR ULCER 3OZ GRN STR (SYRINGE) ×2 IMPLANT
SYR CONTROL 10ML LL (SYRINGE) IMPLANT
TOWEL GREEN STERILE FF (TOWEL DISPOSABLE) ×4 IMPLANT
TUBE CONNECTING 20X1/4 (TUBING) IMPLANT
UNDERPAD 30X36 HEAVY ABSORB (UNDERPADS AND DIAPERS) ×2 IMPLANT
YANKAUER SUCT BULB TIP NO VENT (SUCTIONS) IMPLANT

## 2024-07-19 NOTE — Anesthesia Procedure Notes (Signed)
 Anesthesia Regional Block: Adductor canal block   Pre-Anesthetic Checklist: , timeout performed,  Correct Patient, Correct Site, Correct Laterality,  Correct Procedure, Correct Position, site marked,  Risks and benefits discussed,  Surgical consent,  Pre-op evaluation,  At surgeon's request and post-op pain management  Laterality: Left  Prep: chloraprep       Needles:  Injection technique: Single-shot  Needle Type: Echogenic Needle     Needle Length: 9cm  Needle Gauge: 21     Additional Needles:   Narrative:  Start time: 07/19/2024 12:06 PM End time: 07/19/2024 12:10 PM Injection made incrementally with aspirations every 5 mL.  Performed by: Personally  Anesthesiologist: Maryclare Cornet, MD  Additional Notes: Pt tolerated the procedure well.

## 2024-07-19 NOTE — H&P (Signed)
 H&P Update:  -History and Physical Reviewed  -Patient has been re-examined  -No change in the plan of care  -The risks and benefits were presented and reviewed. The risks due to hardware/suture failure and/or irritation (if removing hardware: inability to remove part/all of hardware, recurrent instability), new/persistent infection, stiffness, nerve/vessel/tendon injury or rerupture of repaired tendon, nonunion/malunion, allograft usage, wound healing issues, development of arthritis, failure of this surgery, possibility of external fixation with delayed definitive surgery, need for further surgery, thromboembolic events, anesthesia/medical complications, amputation, death among others were discussed. The patient acknowledged the explanation, agreed to proceed with the plan and a consent was signed.  Lori [name]

## 2024-07-19 NOTE — Anesthesia Procedure Notes (Signed)
 Anesthesia Regional Block: Popliteal block   Pre-Anesthetic Checklist: , timeout performed,  Correct Patient, Correct Site, Correct Laterality,  Correct Procedure, Correct Position, site marked,  Risks and benefits discussed,  Surgical consent,  Pre-op evaluation,  At surgeon's request and post-op pain management  Laterality: Left  Prep: chloraprep       Needles:  Injection technique: Single-shot  Needle Type: Echogenic Stimulator Needle          Additional Needles:   Procedures:, nerve stimulator,,,,,     Nerve Stimulator or Paresthesia:  Response: plantar flexion of foot, 0.45 mA  Additional Responses:   Narrative:  Start time: 07/19/2024 12:00 PM End time: 07/19/2024 12:06 PM Injection made incrementally with aspirations every 5 mL.  Performed by: Personally  Anesthesiologist: Maryclare Cornet, MD  Additional Notes: Functioning IV was confirmed and monitors were applied.  A 90mm 21ga Arrow echogenic stimulator needle was used. Sterile prep and drape,hand hygiene and sterile gloves were used.  Negative aspiration and negative test dose prior to incremental administration of local anesthetic. The patient tolerated the procedure well.  Ultrasound guidance: relevent anatomy identified, needle position confirmed, local anesthetic spread visualized around nerve(s), vascular puncture avoided.  Image printed for medical record.

## 2024-07-19 NOTE — Progress Notes (Signed)
Assisted Dr. Chaney Malling with left, adductor canal, popliteal, ultrasound guided block. Side rails up, monitors on throughout procedure. See vital signs in flow sheet. Tolerated Procedure well. ?

## 2024-07-19 NOTE — Transfer of Care (Signed)
 Immediate Anesthesia Transfer of Care Note  Patient: Lori [name]  Procedure(s) Performed: RECONSTRUCTION, TENDON, ACHILLES, POSSIBLE TENDON TRANSFER (Left) FASCIECTOMY, PLANTAR (Left) CALCANEAL EXOSTECTOMY (Left: Heel)  Patient Location: PACU  Anesthesia Type:General and Regional  Level of Consciousness: awake, alert , and patient cooperative  Airway & Oxygen Therapy: Patient Spontanous Breathing and Patient connected to nasal cannula oxygen  Post-op Assessment: Report given to RN and Post -op Vital signs reviewed and stable  Post vital signs: Reviewed and stable  Last Vitals:  Vitals Value Taken Time  BP 131/69 07/19/24 14:09  Temp 36.5 C 07/19/24 14:09  Pulse 97 07/19/24 14:10  Resp 21 07/19/24 14:10  SpO2 97 % 07/19/24 14:10  Vitals shown include unfiled device data.  Last Pain:  Vitals:   07/19/24 1049  TempSrc: Temporal  PainSc: 0-No pain         Complications: No notable events documented.

## 2024-07-19 NOTE — Anesthesia Preprocedure Evaluation (Signed)
"                                    Anesthesia Evaluation  Patient identified by MRN, date of birth, ID band Patient awake    Reviewed: Allergy & Precautions, H&P , NPO status , Patient's Chart, lab work & pertinent test results  Airway Mallampati: II   Neck ROM: full    Dental   Pulmonary neg pulmonary ROS   breath sounds clear to auscultation       Cardiovascular negative cardio ROS  Rhythm:regular Rate:Normal     Neuro/Psych  Headaches  Neuromuscular disease    GI/Hepatic ,GERD  ,,  Endo/Other  Hypothyroidism    Renal/GU      Musculoskeletal  (+) Arthritis ,    Abdominal   Peds  Hematology   Anesthesia Other Findings   Reproductive/Obstetrics                              Anesthesia Physical Anesthesia Plan  ASA: 2  Anesthesia Plan: General   Post-op Pain Management: Regional block*   Induction: Intravenous  PONV Risk Score and Plan: 3 and Ondansetron , Dexamethasone , Midazolam  and Treatment may vary due to age or medical condition  Airway Management Planned: Oral ETT  Additional Equipment:   Intra-op Plan:   Post-operative Plan: Extubation in OR  Informed Consent: I have reviewed the patients History and Physical, chart, labs and discussed the procedure including the risks, benefits and alternatives for the proposed anesthesia with the patient or authorized representative who has indicated his/her understanding and acceptance.     Dental advisory given  Plan Discussed with: CRNA, Anesthesiologist and Surgeon  Anesthesia Plan Comments:         Anesthesia Quick Evaluation  "

## 2024-07-19 NOTE — Anesthesia Procedure Notes (Signed)
 Procedure Name: Intubation Date/Time: 07/19/2024 12:32 PM  Performed by: Donnell Berwyn SQUIBB, CRNAPre-anesthesia Checklist: Patient identified, Emergency Drugs available, Suction available, Patient being monitored and Timeout performed Patient Re-evaluated:Patient Re-evaluated prior to induction Oxygen Delivery Method: Circle system utilized Preoxygenation: Pre-oxygenation with 100% oxygen Induction Type: IV induction Ventilation: Mask ventilation without difficulty Laryngoscope Size: Mac and 3 Grade View: Grade I Tube type: Oral Tube size: 7.0 mm Number of attempts: 1 Airway Equipment and Method: Stylet Placement Confirmation: positive ETCO2 and breath sounds checked- equal and bilateral Tube secured with: Tape Dental Injury: Teeth and Oropharynx as per pre-operative assessment

## 2024-07-19 NOTE — Op Note (Signed)
 07/19/2024  3:39 PM   PATIENT: Lori [name]  63 y.o. 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   INDICATION FOR PROCEDURE: The patient presented with above diagnosis.  We discussed the diagnosis, alternative treatment options, risks and benefits of the above surgical intervention, as well as alternative non-operative treatments. All questions/concerns were addressed and the patient/family demonstrated appropriate understanding of the diagnosis, the procedure, the postoperative course, and overall prognosis. The patient wished to proceed with surgical intervention and signed an informed surgical consent as such, in each others presence prior to surgery.   PROCEDURE IN DETAIL: After preoperative consent was obtained and the correct operative site was identified, the patient was brought to the operating room supine on stretcher and transferred onto operating table. General anesthesia was induced. Preoperative antibiotics were administered. Surgical timeout was taken. The patient was then positioned prone with a contralateral hip bump. The operative lower extremity was prepped and draped in standard sterile fashion with a tourniquet around the thigh. The extremity was exsanguinated and the tourniquet was inflated to 275 mmHg.  We then made a limited plantar incision 2 cm  distal to the origin of the plantar fascia to perform the plantar fasciectomy. Dissection was carefully carried down to the level of the plantar fascia taking care to protected nerve branches. We then used a freer elevator to define the medial and central portions of the plantar fascia. A fresh 15 blade was used to perform a plantar fasciectomy with excellent relaxation of the inflamed thickened fascia in this region.   Attention was turned to the posterior heel where a longitudinal incision was made. Dissection was carried down through the subcutaneous tissues and paratenon. The Achilles was then split longitudinally and released from its insertion medially and laterally. The tendon was debrided of all degenerated fibers and calcifications. An oscillating saw was used to resect the insertional enthesophyte and prominent Haglund deformity. The tendon was then repaired to the cut surface of bone using two Arthrex Fibertak anchors and two Swivelock anchors with an hourglass pattern of suture. The ankle was dorsiflexed to neutral and there was confirmed to be no gapping at repair site. The tendon was further repaired with 1 Vicryl along logitudinal split.  A posterior compartment fasciotomy was performed by incising the sheath of the flexor hallucis longus tendon.   The surgical sites were thoroughly irrigated. The tourniquet was deflated and hemostasis achieved. The paratenon and deep layers were closed using 2-0 vicryl. The skin was closed without tension.    The leg was cleaned with saline and sterile dressings with gauze were applied. A well padded bulky short leg splint was applied in resting plantarflexion. The patient was awakened from anesthesia and transported to the recovery room in stable condition.      FOLLOW UP PLAN: -transfer to PACU, then home -strict NWB operative extremity, maximum elevation -maintain short leg splint until follow up -DVT  ppx: Aspirin  81 mg twice daily while  NWB -follow up as outpatient within 7-10 days for wound check with exchange of short leg splint to short leg cast -sutures out in 2-3 weeks in outpatient office     RADIOGRAPHS: AP, lateral, oblique radiographs of the operative ankle were obtained intraoperatively. These showed interval excision of the calcaneal Haglund prominence and insertional spurring. No other acute injuries are noted.     Lillia Mountain [facility]

## 2024-07-20 NOTE — Anesthesia Postprocedure Evaluation (Signed)
"   Anesthesia Post Note  Patient: ALVETTA HIDROGO  Procedure(s) Performed: RECONSTRUCTION, TENDON, ACHILLES, POSSIBLE TENDON TRANSFER (Left) FASCIECTOMY, PLANTAR (Left) CALCANEAL EXOSTECTOMY (Left: Heel)     Patient location during evaluation: PACU Anesthesia Type: General and Regional Level of consciousness: awake and alert Pain management: pain level controlled Vital Signs Assessment: post-procedure vital signs reviewed and stable Respiratory status: spontaneous breathing, nonlabored ventilation, respiratory function stable and patient connected to nasal cannula oxygen Cardiovascular status: blood pressure returned to baseline and stable Postop Assessment: no apparent nausea or vomiting Anesthetic complications: no   No notable events documented.  Last Vitals:  Vitals:   07/19/24 1445 07/19/24 1500  BP: 126/66 124/66  Pulse: 80 89  Resp: 18 18  Temp:  36.5 C  SpO2: 94% 95%    Last Pain:  Vitals:   07/19/24 1500  TempSrc:   PainSc: 0-No pain                 Kendrik Mcshan S      "

## 2024-07-24 ENCOUNTER — Encounter (HOSPITAL_BASED_OUTPATIENT_CLINIC_OR_DEPARTMENT_OTHER): Payer: Self-pay | Admitting: Orthopaedic Surgery

## 2024-07-27 ENCOUNTER — Encounter (HOSPITAL_BASED_OUTPATIENT_CLINIC_OR_DEPARTMENT_OTHER): Payer: Self-pay | Admitting: Orthopaedic Surgery

## 2024-08-01 ENCOUNTER — Encounter (HOSPITAL_BASED_OUTPATIENT_CLINIC_OR_DEPARTMENT_OTHER): Payer: Self-pay | Admitting: Orthopaedic Surgery
